# Patient Record
Sex: Female | Born: 2000 | Race: Black or African American | Hispanic: No | Marital: Single | Smoking: Never smoker
Health system: Southern US, Community
[De-identification: ages and names within clinical notes are randomized; demographics above are authoritative.]

## PROBLEM LIST (undated history)

## (undated) DIAGNOSIS — F938 Other childhood emotional disorders: Secondary | ICD-10-CM

## (undated) DIAGNOSIS — F3181 Bipolar II disorder: Secondary | ICD-10-CM

## (undated) DIAGNOSIS — N926 Irregular menstruation, unspecified: Secondary | ICD-10-CM

## (undated) DIAGNOSIS — R1110 Vomiting, unspecified: Secondary | ICD-10-CM

## (undated) DIAGNOSIS — F419 Anxiety disorder, unspecified: Secondary | ICD-10-CM

## (undated) DIAGNOSIS — F329 Major depressive disorder, single episode, unspecified: Secondary | ICD-10-CM

## (undated) DIAGNOSIS — H6690 Otitis media, unspecified, unspecified ear: Secondary | ICD-10-CM

## (undated) DIAGNOSIS — R519 Headache, unspecified: Secondary | ICD-10-CM

## (undated) DIAGNOSIS — S82843A Displaced bimalleolar fracture of unspecified lower leg, initial encounter for closed fracture: Secondary | ICD-10-CM

## (undated) DIAGNOSIS — F32A Depression, unspecified: Secondary | ICD-10-CM

## (undated) DIAGNOSIS — M009 Pyogenic arthritis, unspecified: Secondary | ICD-10-CM

## (undated) DIAGNOSIS — R51 Headache: Secondary | ICD-10-CM

## (undated) DIAGNOSIS — J302 Other seasonal allergic rhinitis: Secondary | ICD-10-CM

## (undated) DIAGNOSIS — E669 Obesity, unspecified: Secondary | ICD-10-CM

## (undated) HISTORY — DX: Displaced bimalleolar fracture of unspecified lower leg, initial encounter for closed fracture: S82.843A

## (undated) HISTORY — DX: Pyogenic arthritis, unspecified: M00.9

---
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
## 2018-03-21 ENCOUNTER — Ambulatory Visit: Payer: Medicaid Other | Admitting: Family Medicine

## 2019-11-04 ENCOUNTER — Emergency Department (HOSPITAL_COMMUNITY): Payer: Self-pay

## 2019-11-04 ENCOUNTER — Encounter (HOSPITAL_COMMUNITY): Payer: Self-pay | Admitting: *Deleted

## 2019-11-04 ENCOUNTER — Emergency Department (HOSPITAL_COMMUNITY)
Admission: EM | Admit: 2019-11-04 | Discharge: 2019-11-04 | Disposition: A | Discharge status: Home / Self Care | Payer: Self-pay | Attending: Emergency Medicine | Admitting: Emergency Medicine

## 2019-11-04 DIAGNOSIS — Z5321 Procedure and treatment not carried out due to patient leaving prior to being seen by health care provider: Secondary | ICD-10-CM | POA: Insufficient documentation

## 2019-11-04 DIAGNOSIS — R079 Chest pain, unspecified: Secondary | ICD-10-CM | POA: Insufficient documentation

## 2019-11-04 DIAGNOSIS — R109 Unspecified abdominal pain: Secondary | ICD-10-CM | POA: Insufficient documentation

## 2019-11-04 LAB — BASIC METABOLIC PANEL
Anion gap: 17 — ABNORMAL HIGH (ref 5–15)
BUN: 10 mg/dL (ref 6–20)
CO2: 18 mmol/L — ABNORMAL LOW (ref 22–32)
Calcium: 10.2 mg/dL (ref 8.9–10.3)
Chloride: 107 mmol/L (ref 98–111)
Creatinine, Ser: 1.35 mg/dL — ABNORMAL HIGH (ref 0.44–1.00)
GFR calc Af Amer: >60 mL/min (ref >60)
GFR calc non Af Amer: 56 mL/min — ABNORMAL LOW (ref >60)
Glucose, Bld: 269 mg/dL — ABNORMAL HIGH (ref 70–99)
Potassium: 4.2 mmol/L (ref 3.5–5.1)
Sodium: 142 mmol/L (ref 135–145)

## 2019-11-04 LAB — I-STAT BETA HCG BLOOD, ED (MC, WL, AP ONLY): I-stat hCG, quantitative: <5 m[IU]/mL (ref <5)

## 2019-11-04 LAB — CBC
HCT: 43 % (ref 36.0–46.0)
Hemoglobin: 14 g/dL (ref 12.0–15.0)
MCH: 30.8 pg (ref 26.0–34.0)
MCHC: 32.6 g/dL (ref 30.0–36.0)
MCV: 94.5 fL (ref 80.0–100.0)
Platelets: 312 10*3/uL (ref 150–400)
RBC: 4.55 MIL/uL (ref 3.87–5.11)
RDW: 11.9 % (ref 11.5–15.5)
WBC: 12.7 10*3/uL — ABNORMAL HIGH (ref 4.0–10.5)
nRBC: 0 % (ref 0.0–0.2)

## 2019-11-04 LAB — TROPONIN I (HIGH SENSITIVITY): Troponin I (High Sensitivity): 6 ng/L (ref <18)

## 2019-11-04 NOTE — ED Notes (Signed)
Pt stating she does not want to wait any longer and will try going somewhere else. IV taken out and Pt seen leaving with mother.

## 2019-11-04 NOTE — ED Triage Notes (Signed)
To ED via EMS- from work, for eval of cp and abd pain while at work. 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 States she started with nausea and went to restroom where she had an episode of vomiting then cp started. Pt given 500cc fluids, zofan IV, 325mg  ASA by EMS prior to arrival. Pt appears in nad. Denies numbness to extremities or sob.

## 2020-01-07 ENCOUNTER — Other Ambulatory Visit: Payer: Self-pay

## 2020-01-07 ENCOUNTER — Emergency Department (HOSPITAL_COMMUNITY)
Admission: EM | Admit: 2020-01-07 | Discharge: 2020-01-07 | Disposition: A | Discharge status: Home / Self Care | Payer: Self-pay | Attending: Emergency Medicine | Admitting: Emergency Medicine

## 2020-01-07 ENCOUNTER — Encounter (HOSPITAL_COMMUNITY): Payer: Self-pay

## 2020-01-07 ENCOUNTER — Emergency Department (HOSPITAL_COMMUNITY): Payer: Self-pay

## 2020-01-07 DIAGNOSIS — R1013 Epigastric pain: Secondary | ICD-10-CM | POA: Insufficient documentation

## 2020-01-07 DIAGNOSIS — Z79899 Other long term (current) drug therapy: Secondary | ICD-10-CM | POA: Insufficient documentation

## 2020-01-07 DIAGNOSIS — Z7722 Contact with and (suspected) exposure to environmental tobacco smoke (acute) (chronic): Secondary | ICD-10-CM | POA: Insufficient documentation

## 2020-01-07 DIAGNOSIS — I10 Essential (primary) hypertension: Secondary | ICD-10-CM | POA: Insufficient documentation

## 2020-01-07 LAB — CBC WITH DIFFERENTIAL/PLATELET
Abs Immature Granulocytes: 0.03 10*3/uL (ref 0.00–0.07)
Basophils Absolute: 0 10*3/uL (ref 0.0–0.1)
Basophils Relative: 0 %
Eosinophils Absolute: 0 10*3/uL (ref 0.0–0.5)
Eosinophils Relative: 0 %
HCT: 43.5 % (ref 36.0–46.0)
Hemoglobin: 14.5 g/dL (ref 12.0–15.0)
Immature Granulocytes: 0 %
Lymphocytes Relative: 31 %
Lymphs Abs: 2.3 10*3/uL (ref 0.7–4.0)
MCH: 31.7 pg (ref 26.0–34.0)
MCHC: 33.3 g/dL (ref 30.0–36.0)
MCV: 95.2 fL (ref 80.0–100.0)
Monocytes Absolute: 0.5 10*3/uL (ref 0.1–1.0)
Monocytes Relative: 7 %
Neutro Abs: 4.8 10*3/uL (ref 1.7–7.7)
Neutrophils Relative %: 62 %
Platelets: 273 10*3/uL (ref 150–400)
RBC: 4.57 MIL/uL (ref 3.87–5.11)
RDW: 11.9 % (ref 11.5–15.5)
WBC: 7.7 10*3/uL (ref 4.0–10.5)
nRBC: 0 % (ref 0.0–0.2)

## 2020-01-07 LAB — URINALYSIS, ROUTINE W REFLEX MICROSCOPIC
Bilirubin Urine: NEGATIVE
Glucose, UA: >=500 mg/dL — AB
Hgb urine dipstick: NEGATIVE
Ketones, ur: 20 mg/dL — AB
Leukocytes,Ua: NEGATIVE
Nitrite: NEGATIVE
Protein, ur: NEGATIVE mg/dL
Specific Gravity, Urine: 1.026 (ref 1.005–1.030)
pH: 8 (ref 5.0–8.0)

## 2020-01-07 LAB — COMPREHENSIVE METABOLIC PANEL
ALT: 60 U/L — ABNORMAL HIGH (ref 0–44)
AST: 58 U/L — ABNORMAL HIGH (ref 15–41)
Albumin: 4.5 g/dL (ref 3.5–5.0)
Alkaline Phosphatase: 72 U/L (ref 38–126)
Anion gap: 14 (ref 5–15)
BUN: 10 mg/dL (ref 6–20)
CO2: 20 mmol/L — ABNORMAL LOW (ref 22–32)
Calcium: 9.8 mg/dL (ref 8.9–10.3)
Chloride: 106 mmol/L (ref 98–111)
Creatinine, Ser: 0.58 mg/dL (ref 0.44–1.00)
GFR, Estimated: >60 mL/min (ref >60)
Glucose, Bld: 244 mg/dL — ABNORMAL HIGH (ref 70–99)
Potassium: 3.9 mmol/L (ref 3.5–5.1)
Sodium: 140 mmol/L (ref 135–145)
Total Bilirubin: 1 mg/dL (ref 0.3–1.2)
Total Protein: 8 g/dL (ref 6.5–8.1)

## 2020-01-07 LAB — I-STAT BETA HCG BLOOD, ED (MC, WL, AP ONLY): I-stat hCG, quantitative: <5 m[IU]/mL (ref <5)

## 2020-01-07 LAB — LIPASE, BLOOD: Lipase: 27 U/L (ref 11–51)

## 2020-01-07 MED ORDER — SODIUM CHLORIDE 0.9 % IV BOLUS
1000.0000 mL | Freq: Once | INTRAVENOUS | Status: AC
  Administered 2020-01-07: 1000 mL via INTRAVENOUS

## 2020-01-07 MED ORDER — FAMOTIDINE IN NACL 20-0.9 MG/50ML-% IV SOLN
20.0000 mg | Freq: Once | INTRAVENOUS | Status: AC
  Administered 2020-01-07: 20 mg via INTRAVENOUS
  Filled 2020-01-07: qty 50

## 2020-01-07 MED ORDER — FAMOTIDINE 20 MG PO TABS: 20.0000 mg | ORAL_TABLET | Freq: Every day | ORAL | 1 refills | Status: DC

## 2020-01-07 MED ORDER — SUCRALFATE 1 G PO TABS: 1.0000 g | ORAL_TABLET | Freq: Three times a day (TID) | ORAL | 0 refills | Status: DC

## 2020-01-07 MED ORDER — LIDOCAINE VISCOUS HCL 2 % MT SOLN
15.0000 mL | Freq: Once | OROMUCOSAL | Status: AC
  Administered 2020-01-07: 15 mL via ORAL
  Filled 2020-01-07: qty 15

## 2020-01-07 MED ORDER — ALUM & MAG HYDROXIDE-SIMETH 200-200-20 MG/5ML PO SUSP
30.0000 mL | Freq: Once | ORAL | Status: AC
  Administered 2020-01-07: 30 mL via ORAL
  Filled 2020-01-07: qty 30

## 2020-01-07 MED ORDER — IOHEXOL 300 MG/ML  SOLN
100.0000 mL | Freq: Once | INTRAMUSCULAR | Status: AC | PRN
  Administered 2020-01-07: 100 mL via INTRAVENOUS

## 2020-01-07 NOTE — ED Notes (Signed)
Patient has a urine culture in the main lab 

## 2020-01-07 NOTE — ED Notes (Signed)
Unable to get vitals patient is in CT

## 2020-01-07 NOTE — ED Provider Notes (Addendum)
[facility] DEPT Provider Note   CSN: [id number] Arrival date & time: 01/07/20  4098     History Chief Complaint  Patient presents with  . Abdominal Pain    Megan Trevino is a 19 y.o. female.  HPI Patient is a 19 year old female with a medical history as noted below.  She presents to the emergency department via EMS due to abdominal pain.  Patient states that her current abdominal pain started about 2 days ago and is worst along the epigastrium.  Reports history of similar symptoms in the past and states that she was told she had "lesions" in the region but denies ever being given a formal diagnosis for her symptoms.  States that her pain has been worsening and patient is tearful throughout my exam.  Reports one episode of nonbilious nonbloody vomiting last night but denies any current vomiting or diarrhea today.  No fevers, chills, vaginal discharge, vaginal bleeding, pelvic pain, chest pain, shortness of breath, leg swelling, dysuria. Pt states she drinks about 1 ETOH drink per week. She smokes marijuana irregularly but denies any other drug use.     Past Medical History:  Diagnosis Date  . Acute ear infection 03/25/2015  . Ankle fracture, bimalleolar, closed 08/27/2014  . Anxiety   . 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 Bipolar 2 disorder ([place])   . Depression   . Headache   . Infection of joint of ankle ([place]) 10/16/2014  . Irregular menses   . Obesity   . Seasonal allergies   . Vomiting    pt. remarks that she has "stomach problem", they haven't figured out what the problem is yet.  Mother states it seems to be associated with her period.      Patient Active Problem List   Diagnosis Date Noted  . Right ear pain 02/23/2017  . Vaginal burning 07/16/2015  . Generalized anxiety disorder 04/22/2015  . Attention deficit hyperactivity disorder (ADHD) 04/22/2015  . 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 Blurred vision, bilateral   . Major depressive disorder, recurrent, severe without psychotic features ([place]) 02/20/2015  . Morbid obesity ([place]) 02/19/2015  . Hypertension 11/12/2014  . Adjustment reaction with anxious mood   . Nausea with vomiting   . Menstrual irregularity 09/19/2014  . Chronic abdominal pain 09/13/2014  . Depression 12/15/2012    Past Surgical History:  Procedure Laterality Date  . I & D EXTREMITY Right 10/20/2014   Procedure: IRRIGATION AND DEBRIDEMENT EXTREMITY WITH ANTIBOTIC BEADS;  Surgeon: Meredith Pel, MD;  Location: [facility];  Service: Orthopedics;  Laterality: Right;  . ORIF ANKLE FRACTURE Right 08/27/2014   Procedure: OPEN REDUCTION INTERNAL FIXATION (ORIF) ANKLE FRACTURE;  Surgeon: Meredith Pel, MD;  Location: [facility];  Service: Orthopedics;  Laterality: Right;     OB History   No obstetric history on file.     Family History  Problem Relation Age of Onset  . Alcohol abuse Mother   . Depression Mother   . Mental illness Mother   . Alcohol abuse Maternal Grandmother   . Mental illness Maternal Grandmother   . Alcohol abuse Father   . Depression Sister     Social History   Tobacco Use  . Smoking status: Passive Smoke Exposure - Never Smoker  . Smokeless tobacco: Never Used  Substance Use Topics  . Alcohol use: No    Comment: occasional drinker and THC  use per record hx  . Drug use: No    Types: Marijuana    Comment: THC smoked in 7th grade, stopped THC in 8th grade 2 blunts per record hx    Home Medications Prior to Admission medications   Medication Sig Start Date End Date Taking? Authorizing Provider  amoxicillin-clavulanate (AUGMENTIN) 875-125 MG tablet Take 1 tablet by mouth 2 (two) times daily. 02/23/17   Verner Mould, MD  ARIPiprazole (ABILIFY) 10 MG tablet Take 1 tablet (10 mg total) by mouth at bedtime. 06/10/15   Leonides Grills, MD    dicyclomine (BENTYL) 20 MG tablet Take 1 tablet (20 mg total) by mouth 2 (two) times daily. 03/28/16   Mikell, Jeani Sow, MD  hydrochlorothiazide (HYDRODIURIL) 12.5 MG tablet Take 1 tablet (12.5 mg total) by mouth daily. 11/10/15   McKeag, Marylynn Pearson, MD  metFORMIN (GLUCOPHAGE) 500 MG tablet Take 1 tablet (500 mg total) by mouth 2 (two) times daily with a meal. 11/10/15   McKeag, Marylynn Pearson, MD  pantoprazole (PROTONIX) 40 MG tablet Take 1 tablet (40 mg total) by mouth daily. 03/28/16   Mikell, Jeani Sow, MD  polyethylene glycol (MIRALAX / GLYCOLAX) packet Take 17 g by mouth daily. 03/28/16   Mikell, Jeani Sow, MD  sertraline (ZOLOFT) 50 MG tablet Take 3 tablets (150 mg total) by mouth daily. 06/10/15   Leonides Grills, MD    Allergies    Patient has no known allergies.  Review of Systems   Review of Systems  All other systems reviewed and are negative. Ten systems reviewed and are negative for acute change, except as noted in the HPI.   Physical Exam Updated Vital Signs BP (!) 142/98 (BP Location: Left Arm)   Pulse 88   Temp 97.6 F (36.4 C) (Oral)   Resp 20   Ht 5\' 9"  (1.753 m)   Wt 131.5 kg   LMP  (LMP Unknown)   SpO2 97%   BMI 42.83 kg/m   Physical Exam Vitals and nursing note reviewed.  Constitutional:      General: She is not in acute distress.    Appearance: Normal appearance. She is well-developed. She is obese. She is not ill-appearing, toxic-appearing or diaphoretic.     Comments: Patient is a well-developed adult female lying in the semi-Fowlers position.  She is tearful throughout my exam and appears to be in pain.  She speaks clearly and coherently.  HENT:     Head: Normocephalic and atraumatic.     Right Ear: External ear normal.     Left Ear: External ear normal.     Nose: Nose normal.     Mouth/Throat:     Mouth: Mucous membranes are moist.     Pharynx: Oropharynx is clear. No oropharyngeal exudate or posterior oropharyngeal erythema.  Eyes:     Extraocular  Movements: Extraocular movements intact.  Cardiovascular:     Rate and Rhythm: Normal rate and regular rhythm.     Pulses: Normal pulses.     Heart sounds: Normal heart sounds. No murmur heard.  No friction rub. No gallop.   Pulmonary:     Effort: Pulmonary effort is normal. No respiratory distress.     Breath sounds: Normal breath sounds. No stridor. No wheezing, rhonchi or rales.  Abdominal:     General: Abdomen is flat and protuberant. There is no distension.     Palpations: Abdomen is soft.     Tenderness: There is abdominal tenderness in the right upper  quadrant and epigastric area. There is no guarding or rebound. Negative signs include McBurney's sign.     Comments: Abdomen is soft.  Moderate tenderness noted along the epigastrium and right upper quadrant.  Pain seems to be worst along the epigastrium.  No rebound or guarding.  Negative McBurney point tenderness.  No lower abdominal tenderness.  Musculoskeletal:        General: Normal range of motion.     Cervical back: Normal range of motion and neck supple. No tenderness.  Skin:    General: Skin is warm and dry.  Neurological:     General: No focal deficit present.     Mental Status: She is alert and oriented to person, place, and time.  Psychiatric:        Mood and Affect: Mood normal.        Behavior: Behavior normal.    ED Results / Procedures / Treatments   Labs (all labs ordered are listed, but only abnormal results are displayed) Labs Reviewed  COMPREHENSIVE METABOLIC PANEL - Abnormal; Notable for the following components:      Result Value   CO2 20 (*)    Glucose, Bld 244 (*)    AST 58 (*)    ALT 60 (*)    All other components within normal limits  URINALYSIS, ROUTINE W REFLEX MICROSCOPIC - Abnormal; Notable for the following components:   APPearance HAZY (*)    Glucose, UA >=500 (*)    Ketones, ur 20 (*)    Bacteria, UA RARE (*)    All other components within normal limits  LIPASE, BLOOD  CBC WITH  DIFFERENTIAL/PLATELET  I-STAT BETA HCG BLOOD, ED ([facility], [facility], [facility] ONLY)    EKG None  Radiology CT ABDOMEN PELVIS W CONTRAST  Result Date: 01/07/2020 CLINICAL DATA:  Upper abdominal pain, primarily right sided EXAM: CT ABDOMEN AND PELVIS WITH CONTRAST TECHNIQUE: Multidetector CT imaging of the abdomen and pelvis was performed using the standard protocol following bolus administration of intravenous contrast. CONTRAST:  182mL OMNIPAQUE IOHEXOL 300 MG/ML  SOLN COMPARISON:  CT abdomen and pelvis April 08, 2014; abdominal MRI September 14, 2014 FINDINGS: Lower chest: Lung bases are clear. Hepatobiliary: There is underlying hepatic steatosis. There is a subtle enhancing lesion in the right lobe of the liver in segment 5 measuring 3.2 x 2.9 cm, similar in size compared to prior MR from 2016. No other liver lesions are appreciable by CT. Gallbladder wall is not appreciably thickened. There is no evident biliary duct dilatation. Pancreas: No pancreatic mass or inflammatory focus. Spleen: No splenic lesions evident. Adrenals/Urinary Tract: Adrenals bilaterally appear normal. Kidneys bilaterally show no evident mass or hydronephrosis on either side. No appreciable renal or ureteral calculus is evident on either side. Urinary bladder is midline with wall thickness within normal limits. Stomach/Bowel: There is no appreciable bowel wall or mesenteric thickening. No evident bowel obstruction. Terminal ileum appears normal. There is no evident free air or portal venous air. Vascular/Lymphatic: No abdominal aortic aneurysm. No arterial vascular lesions evident. Major venous structures appear patent. There is no evident adenopathy in the abdomen or pelvis. Reproductive: Uterus is midline. Tampon present. No evident adnexal mass. Other: The appendix appears normal. There is no evident ascites or abscess in the abdomen or pelvis. Musculoskeletal: No blastic or lytic bone lesions are evident. There is a prominent spur along the  leftward aspect of L5-S1 which is impressing on the left S1 nerve root. No frank stenosis. No intramuscular or abdominal wall lesions  are evident. IMPRESSION: 1. Mass in the right lobe of the liver appears unchanged compared to 2016 MR study. Stability since 2016 is felt to be indicative of benign etiology. No other liver lesions evident. There is underlying hepatic steatosis. 2. No bowel wall thickening or bowel obstruction. No abscess in the abdomen or pelvis. Appendix appears normal. 3. No evident renal or ureteral calculus. No hydronephrosis. Urinary bladder wall thickness normal. 4. Spur along the leftward aspect at L5-S1 causing impression on the proximal left S1 nerve root. Electronically Signed   By: Lowella Grip III M.D.   On: 01/07/2020 13:45    Procedures Procedures (including critical care time)  Medications Ordered in ED Medications  famotidine (PEPCID) IVPB 20 mg premix (0 mg Intravenous Stopped 01/07/20 1024)  alum & mag hydroxide-simeth (MAALOX/MYLANTA) 200-200-20 MG/5ML suspension 30 mL (30 mLs Oral Given 01/07/20 0953)    And  lidocaine (XYLOCAINE) 2 % viscous mouth solution 15 mL (15 mLs Oral Given 01/07/20 0953)  sodium chloride 0.9 % bolus 1,000 mL (1,000 mLs Intravenous New Bag/Given 01/07/20 0954)  iohexol (OMNIPAQUE) 300 MG/ML solution 100 mL (100 mLs Intravenous Contrast Given 01/07/20 1304)   ED Course  I have reviewed the triage vital signs and the nursing notes.  Pertinent labs & imaging results that were available during my care of the patient were reviewed by me and considered in my medical decision making (see chart for details).  Clinical Course as of Jan 06 1530  Wed Jan 07, 2020  1053 Lipase: 27 [LJ]  1053 WBC: 7.7 [LJ]  1053 I-stat hCG, quantitative: <5.0 [LJ]  1053 Patient notes moderate relief of her abdominal pain with GI cocktail and Pepcid.  Patient has about 400 cc of IV fluids left.  Will reassess after IV fluids and remaining lab work has  resulted. Will obtain a CT scan of the abdomen as well.    [LJ]    Clinical Course User Index [LJ] Rayna Sexton, PA-C   MDM Rules/Calculators/A&P                          Pt is a 19 y.o. female that presents with a history, physical exam, and ED Clinical Course as noted above.   Patient presents today with epigastric pain.  Patient has a history of similar symptoms for which she was evaluated by gastroenterology for in the past.  Physical exam significant for moderate pain along the epigastrium.  No lower abdominal pain.  No pelvic pain.  No complaints of vaginal discharge, dysuria, hematuria.  Negative pregnancy test.   I obtained basic labs on the patient showing an elevated AST at 58 with an ALT of 60.  Hyperglycemia of 244.  No elevation in lipase.  Glucosuria, ketonuria, rare bacteria noted on UA.  CBC without leukocytosis or neutrophilia.  No anion gap.   I obtained a CT scan of the abdomen which I reviewed with findings as noted above.  Patient does have a mass in the right lobe of the liver which appears unchanged based on comparison to an MR study in 2016.  This along with her hepatic steatosis likely explains her elevated LFTs.  No other findings on CT scan to explain patient's pain.  Patient was given IV fluids as well as IV Pepcid and a GI cocktail upon arrival.  She states that this provided moderate relief of her pain.  I am concerned that her pain is possibly secondary to a gastritis given  it was also worse along her epigastrium.  We will start patient on a course of Carafate as well as Pepcid.  She was given GI follow-up and I recommended that she call them soon as possible to get a follow-up appointment.  She knows she can return to the ER with any new or worsening symptoms.  Her questions were answered and she was amicable at the time of discharge.  She was initially hypertensive on arrival which resolved.  Her vital signs are now stable.  She is afebrile, not  tachycardic, normotensive, not hypoxic.   An After Visit Summary was printed and given to the patient.  Patient discharged to home/self care.  Condition at discharge: Stable  Note: Portions of this report may have been transcribed using voice recognition software. Every effort was made to ensure accuracy; however, inadvertent computerized transcription errors may be present.   Final Clinical Impression(s) / ED Diagnoses Final diagnoses:  Epigastric abdominal pain    Rx / DC Orders ED Discharge Orders         Ordered    sucralfate (CARAFATE) 1 g tablet  3 times daily with meals & bedtime        01/07/20 1513    famotidine (PEPCID) 20 MG tablet  Daily        01/07/20 1513           Rayna Sexton, PA-C 01/07/20 1531    Rayna Sexton, PA-C 01/07/20 1537    Tegeler, Gwenyth Allegra, MD 01/07/20 [phone]

## 2020-01-07 NOTE — ED Triage Notes (Signed)
19 yo female BIBA c/o non-radiating midumbilical abd pain and nausea for the past 3 days. No vomiting or diarrhea per EMS.

## 2020-01-07 NOTE — Discharge Instructions (Addendum)
I am prescribing you 2 new medications.  The first medication is called Carafate.  This along with Pepcid could help significantly with your stomach pain.  You can take this up to 3 times a day with meals to help reduce your symptoms.  I am also prescribing a medication called Pepcid.  You can also purchase this over-the-counter.  Please start taking this once per day to help reduce stomach acid and hopefully your pain as well.  I have given you a referral to a gastroenterologist.  Please give them a call as soon as possible to schedule a follow-up appointment.  Please return to the ER if you develop any new or worsening symptoms.  It was a pleasure to meet you.

## 2020-09-26 ENCOUNTER — Other Ambulatory Visit: Payer: Self-pay

## 2020-09-26 ENCOUNTER — Encounter (HOSPITAL_COMMUNITY): Payer: Self-pay | Admitting: *Deleted

## 2020-09-26 ENCOUNTER — Ambulatory Visit (HOSPITAL_COMMUNITY)
Admission: EM | Admit: 2020-09-26 | Discharge: 2020-09-26 | Disposition: A | Discharge status: Other Acute Inpatient Hospital | Payer: Self-pay | Attending: Internal Medicine | Admitting: Internal Medicine

## 2020-09-26 ENCOUNTER — Encounter (HOSPITAL_COMMUNITY): Payer: Self-pay | Admitting: Emergency Medicine

## 2020-09-26 ENCOUNTER — Emergency Department (HOSPITAL_COMMUNITY)
Admission: EM | Admit: 2020-09-26 | Discharge: 2020-09-26 | Disposition: A | Discharge status: Home / Self Care | Payer: Self-pay | Attending: Emergency Medicine | Admitting: Emergency Medicine

## 2020-09-26 ENCOUNTER — Emergency Department (HOSPITAL_COMMUNITY): Payer: Self-pay

## 2020-09-26 DIAGNOSIS — R079 Chest pain, unspecified: Secondary | ICD-10-CM

## 2020-09-26 DIAGNOSIS — R0682 Tachypnea, not elsewhere classified: Secondary | ICD-10-CM | POA: Insufficient documentation

## 2020-09-26 DIAGNOSIS — Z79899 Other long term (current) drug therapy: Secondary | ICD-10-CM | POA: Insufficient documentation

## 2020-09-26 DIAGNOSIS — R52 Pain, unspecified: Secondary | ICD-10-CM

## 2020-09-26 DIAGNOSIS — U071 COVID-19: Secondary | ICD-10-CM

## 2020-09-26 DIAGNOSIS — R0602 Shortness of breath: Secondary | ICD-10-CM

## 2020-09-26 DIAGNOSIS — R000 Tachycardia, unspecified: Secondary | ICD-10-CM

## 2020-09-26 DIAGNOSIS — I10 Essential (primary) hypertension: Secondary | ICD-10-CM | POA: Insufficient documentation

## 2020-09-26 LAB — CBC
HCT: 44.3 % (ref 36.0–46.0)
Hemoglobin: 14.7 g/dL (ref 12.0–15.0)
MCH: 31.6 pg (ref 26.0–34.0)
MCHC: 33.2 g/dL (ref 30.0–36.0)
MCV: 95.3 fL (ref 80.0–100.0)
Platelets: 271 10*3/uL (ref 150–400)
RBC: 4.65 MIL/uL (ref 3.87–5.11)
RDW: 11.5 % (ref 11.5–15.5)
WBC: 7.5 10*3/uL (ref 4.0–10.5)
nRBC: 0 % (ref 0.0–0.2)

## 2020-09-26 LAB — BASIC METABOLIC PANEL
Anion gap: 8 (ref 5–15)
BUN: 10 mg/dL (ref 6–20)
CO2: 24 mmol/L (ref 22–32)
Calcium: 9.4 mg/dL (ref 8.9–10.3)
Chloride: 106 mmol/L (ref 98–111)
Creatinine, Ser: 0.57 mg/dL (ref 0.44–1.00)
GFR, Estimated: >60 mL/min (ref >60)
Glucose, Bld: 182 mg/dL — ABNORMAL HIGH (ref 70–99)
Potassium: 4 mmol/L (ref 3.5–5.1)
Sodium: 138 mmol/L (ref 135–145)

## 2020-09-26 LAB — I-STAT BETA HCG BLOOD, ED (MC, WL, AP ONLY): I-stat hCG, quantitative: <5 m[IU]/mL (ref <5)

## 2020-09-26 LAB — TROPONIN I (HIGH SENSITIVITY)
Troponin I (High Sensitivity): <2 ng/L (ref <18)
Troponin I (High Sensitivity): <2 ng/L (ref <18)

## 2020-09-26 MED ORDER — ACETAMINOPHEN 325 MG PO TABS
650.0000 mg | ORAL_TABLET | Freq: Once | ORAL | Status: AC
  Administered 2020-09-26: 650 mg via ORAL
  Filled 2020-09-26: qty 2

## 2020-09-26 MED ORDER — SODIUM CHLORIDE 0.9 % IV BOLUS
1000.0000 mL | Freq: Once | INTRAVENOUS | Status: AC
  Administered 2020-09-26: 1000 mL via INTRAVENOUS

## 2020-09-26 MED ORDER — ALBUTEROL SULFATE HFA 108 (90 BASE) MCG/ACT IN AERS
2.0000 | INHALATION_SPRAY | Freq: Once | RESPIRATORY_TRACT | Status: AC
  Administered 2020-09-26: 2 via RESPIRATORY_TRACT
  Filled 2020-09-26: qty 6.7

## 2020-09-26 NOTE — ED Triage Notes (Signed)
Patient from [facility]. Patient covid+ Thursday, complaint of shortness of breath and chest pain.

## 2020-09-26 NOTE — ED Provider Notes (Signed)
[facility] EMERGENCY DEPARTMENT Provider Note   CSN: [id number] Arrival date & time: 09/26/20  1404     History Chief Complaint  Patient presents with   Covid+   Chest Pain   Shortness of Breath    Megan Trevino is a 20 y.o. female history of bipolar, depression, here presenting with COVID symptoms.  Patient works for [employer] and was tested positive for COVID during her shift on Thursday night (7/14).  She states that she mainly has some chills and started having shortness of breath for the last 2 days.  Patient denies any actual fevers.  Patient states that health at work called her today and since she has persistent chest pain or shortness of breath she was told to come here for further evaluation.  Patient states that she is vaccinated against COVID but did not get her booster.  She states that she is otherwise healthy.  The history is provided by the patient.      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 remarks that she has "stomach problem", they haven't figured out what the problem is yet.  Mother states it seems to be associated with her period.      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   Blurred vision, bilateral    Major depressive disorder, recurrent, severe without psychotic features ([place]) 02/20/2015   Morbid obesity ([place])  02/19/2015   Hypertension 11/12/2014   Adjustment reaction with anxious mood    Nausea with vomiting    Menstrual irregularity 09/19/2014   Chronic abdominal pain 09/13/2014   Depression 12/15/2012    Past Surgical History:  Procedure Laterality Date   I & D EXTREMITY Right 10/20/2014   Procedure: IRRIGATION AND DEBRIDEMENT EXTREMITY WITH ANTIBOTIC BEADS;  Surgeon: Meredith Pel, MD;  Location: [facility];  Service: Orthopedics;  Laterality: Right;   ORIF ANKLE FRACTURE Right 08/27/2014   Procedure: OPEN REDUCTION INTERNAL FIXATION (ORIF) ANKLE FRACTURE;  Surgeon: Meredith Pel, MD;  Location: [facility];  Service: Orthopedics;  Laterality: Right;     OB History   No obstetric history on file.     Family History  Problem Relation Age of Onset   Alcohol abuse Mother    Depression Mother    Mental illness Mother    Alcohol abuse Father    Depression Sister    Alcohol abuse Maternal Grandmother    Mental illness Maternal Grandmother     Social History   Tobacco Use   Smoking status: Never    Passive exposure: Yes   Smokeless tobacco: Never  Substance Use Topics   Alcohol use: No    Comment: occasional drinker and THC use per record hx   Drug use: No    Types: Marijuana    Comment: THC smoked in 7th grade, stopped  THC in 8th grade 2 blunts per record hx    Home Medications Prior to Admission medications   Medication Sig Start Date End Date Taking? Authorizing Provider  ARIPiprazole (ABILIFY) 10 MG tablet Take 1 tablet (10 mg total) by mouth at bedtime. Patient not taking: Reported on 01/07/2020 06/10/15   Leonides Grills, MD  famotidine (PEPCID) 20 MG tablet Take 1 tablet (20 mg total) by mouth daily. 01/07/20   Rayna Sexton, PA-C  hydrochlorothiazide (HYDRODIURIL) 12.5 MG tablet Take 1 tablet (12.5 mg total) by mouth daily. Patient not taking: Reported on 01/07/2020 11/10/15   McKeag, Marylynn Pearson, MD  metFORMIN (GLUCOPHAGE) 500 MG tablet Take 1 tablet (500 mg total)  by mouth 2 (two) times daily with a meal. Patient not taking: Reported on 01/07/2020 11/10/15   McKeag, Marylynn Pearson, MD  sertraline (ZOLOFT) 50 MG tablet Take 3 tablets (150 mg total) by mouth daily. Patient not taking: Reported on 01/07/2020 06/10/15   Leonides Grills, MD  sucralfate (CARAFATE) 1 g tablet Take 1 tablet (1 g total) by mouth 4 (four) times daily -  with meals and at bedtime. 01/07/20   Rayna Sexton, PA-C    Allergies    Patient has no known allergies.  Review of Systems   Review of Systems  Respiratory:  Positive for shortness of breath.   Cardiovascular:  Positive for chest pain.  All other systems reviewed and are negative.  Physical Exam Updated Vital Signs BP 134/78   Pulse 91   Temp 99.3 F (37.4 C)   Resp 14   LMP  (LMP Unknown) Comment: PT does not know  SpO2 98%   Physical Exam Vitals and nursing note reviewed.  Constitutional:      Comments: Slightly tachypneic  HENT:     Head: Normocephalic.  Eyes:     Extraocular Movements: Extraocular movements intact.     Pupils: Pupils are equal, round, and reactive to light.  Cardiovascular:     Rate and Rhythm: Normal rate and regular rhythm.     Heart sounds: Normal heart sounds.  Pulmonary:     Comments: Diminished bilaterally but no obvious wheezing or crackles Abdominal:     General: Bowel sounds are normal.     Palpations: Abdomen is soft.  Musculoskeletal:        General: Normal range of motion.     Cervical back: Normal range of motion and neck supple.  Skin:    General: Skin is warm.     Capillary Refill: Capillary refill takes less than 2 seconds.  Neurological:     General: No focal deficit present.     Mental Status: She is oriented to person, place, and time.  Psychiatric:        Mood and Affect: Mood normal.        Behavior: Behavior normal.    ED Results / Procedures / Treatments   Labs (all labs ordered are listed, but only abnormal results are displayed) Labs Reviewed   BASIC METABOLIC PANEL - Abnormal; Notable for the following components:      Result Value   Glucose, Bld 182 (*)    All other components within normal limits  CBC  I-STAT BETA HCG BLOOD, ED ([facility], [facility], [facility] ONLY)  TROPONIN I (HIGH SENSITIVITY)  TROPONIN I (HIGH SENSITIVITY)    EKG None  Radiology DG Chest Port 1 View  Result Date: 09/26/2020 CLINICAL DATA:  Shortness of breath and chest pain.  COVID positive. EXAM: PORTABLE CHEST  1 VIEW COMPARISON:  11/04/2019 FINDINGS: The cardiomediastinal silhouette is unremarkable. There is no evidence of focal airspace disease, pulmonary edema, suspicious pulmonary nodule/mass, pleural effusion, or pneumothorax. No acute bony abnormalities are identified. IMPRESSION: No active disease. Electronically Signed   By: Margarette Canada M.D.   On: 09/26/2020 15:09    Procedures Procedures   Medications Ordered in ED Medications  sodium chloride 0.9 % bolus 1,000 mL (1,000 mLs Intravenous New Bag/Given 09/26/20 1646)  albuterol (VENTOLIN HFA) 108 (90 Base) MCG/ACT inhaler 2 puff (2 puffs Inhalation Given 09/26/20 1645)  acetaminophen (TYLENOL) tablet 650 mg (650 mg Oral Given 09/26/20 1645)    ED Course  I have reviewed the triage vital signs and the nursing notes.  Pertinent labs & imaging results that were available during my care of the patient were reviewed by me and considered in my medical decision making (see chart for details).    MDM Rules/Calculators/A&P                         Megan Trevino is a 20 y.o. female here with chest pain, shortness of breath. Tested positive for COVID on 7/14.  Patient has been having ongoing symptoms.  Patient has normal oxygen saturation.  Patient appears comfortable.  Labs were ordered in triage.  Chest x-ray is clear.  At this point, mainly just need symptomatic management.  We will give her some albuterol as needed.  She does not qualify for Paxlovid.   6:55 PM Labs are unremarkable.  Felt better and stable  for discharge.  Told her to stay home and health at work will follow-up with her   Final Clinical Impression(s) / ED Diagnoses Final diagnoses:  Pain    Rx / DC Orders ED Discharge Orders     None        Drenda Freeze, MD 09/26/20 [phone]

## 2020-09-26 NOTE — ED Provider Notes (Signed)
[facility]    CSN: [id number] Arrival date & time: 09/26/20  1211      History   Chief Complaint Chief Complaint  Patient presents with   Chest Pain   Shortness of Breath    HPI Megan Trevino is a 20 y.o. female.   Patient was briefly seen in triage for evaluation.  She is presenting today complaining of 1 day history of generalized body aches, sharp localized mid chest pain and significant shortness of breath.  She had someone at work tested her for [employer] yesterday upon onset of symptoms and she tested positive at that time.  She denies significant congestion, sore throat, headache, dizziness, abdominal pain, nausea vomiting or diarrhea.  Has not been trying anything over-the-counter for symptoms thus far.  Denies any known chronic pulmonary issues.  States it is very abnormal for her to have trouble breathing.  She is a non-smoker, not currently on any hormonal contraceptives, no history of recent surgeries, sedentary behavior, past DVT issues.     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 remarks that she has "stomach problem", they haven't figured out what the problem is yet.  Mother states it seems to be associated with her period.      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   Blurred vision, bilateral    Major depressive disorder, recurrent,  severe without psychotic features ([place]) 02/20/2015   Morbid obesity ([place]) 02/19/2015   Hypertension 11/12/2014   Adjustment reaction with anxious mood    Nausea with vomiting    Menstrual irregularity 09/19/2014   Chronic abdominal pain 09/13/2014   Depression 12/15/2012    Past Surgical History:  Procedure Laterality Date   I & D EXTREMITY Right 10/20/2014   Procedure: IRRIGATION AND DEBRIDEMENT EXTREMITY WITH ANTIBOTIC BEADS;  Surgeon: Meredith Pel, MD;  Location: [facility];  Service: Orthopedics;  Laterality: Right;   ORIF ANKLE FRACTURE Right 08/27/2014   Procedure: OPEN REDUCTION INTERNAL FIXATION (ORIF) ANKLE FRACTURE;  Surgeon: Meredith Pel, MD;  Location: [facility];  Service: Orthopedics;  Laterality: Right;    OB History   No obstetric history on file.      Home Medications    Prior to Admission medications   Medication Sig Start Date End Date Taking? Authorizing Provider  ARIPiprazole (ABILIFY) 10 MG tablet Take 1 tablet (10 mg total) by mouth at bedtime. Patient not taking: Reported on 01/07/2020 06/10/15   Leonides Grills, MD  famotidine (PEPCID) 20 MG tablet Take 1 tablet (20 mg total) by mouth daily. 01/07/20   Rayna Sexton, PA-C  hydrochlorothiazide (HYDRODIURIL) 12.5 MG tablet Take 1 tablet (12.5 mg total) by mouth daily. Patient not taking: Reported on 01/07/2020 11/10/15   McKeag, Marylynn Pearson, MD  metFORMIN (GLUCOPHAGE) 500 MG tablet Take 1 tablet (500 mg total) by  mouth 2 (two) times daily with a meal. Patient not taking: Reported on 01/07/2020 11/10/15   McKeag, Marylynn Pearson, MD  sertraline (ZOLOFT) 50 MG tablet Take 3 tablets (150 mg total) by mouth daily. Patient not taking: Reported on 01/07/2020 06/10/15   Leonides Grills, MD  sucralfate (CARAFATE) 1 g tablet Take 1 tablet (1 g total) by mouth 4 (four) times daily -  with meals and at bedtime. 01/07/20   Rayna Sexton, PA-C    Family History Family History  Problem Relation Age of Onset   Alcohol  abuse Mother    Depression Mother    Mental illness Mother    Alcohol abuse Father    Depression Sister    Alcohol abuse Maternal Grandmother    Mental illness Maternal Grandmother     Social History Social History   Tobacco Use   Smoking status: Never    Passive exposure: Yes   Smokeless tobacco: Never  Substance Use Topics   Alcohol use: No    Comment: occasional drinker and THC use per record hx   Drug use: No    Types: Marijuana    Comment: THC smoked in 7th grade, stopped THC in 8th grade 2 blunts per record hx     Allergies   Patient has no known allergies.   Review of Systems Review of Systems PER HPI    Physical Exam Triage Vital Signs ED Triage Vitals [09/26/20 1301]  Enc Vitals Group     BP 132/75     Pulse Rate (!) 102     Resp 20     Temp 98.6 F (37 C)     Temp src      SpO2 99 %     Weight      Height      Head Circumference      Peak Flow      Pain Score 5     Pain Loc      Pain Edu?      Excl. in [place]?    No data found.  Updated Vital Signs BP 132/75   Pulse (!) 102   Temp 98.6 F (37 C)   Resp 20   LMP  (LMP Unknown) Comment: PT does not know  SpO2 99%   Visual Acuity Right Eye Distance:   Left Eye Distance:   Bilateral Distance:    Right Eye Near:   Left Eye Near:    Bilateral Near:      Physical exam abbreviated today as the decision was already made with the patient to go to the emergency department for further evaluation which she is agreeable to Physical Exam Vitals and nursing note reviewed.  Constitutional:      Comments: Appears uncomfortable  HENT:     Head: Atraumatic.     Nose: Nose normal.     Mouth/Throat:     Mouth: Mucous membranes are moist.     Pharynx: Posterior oropharyngeal erythema present.  Eyes:     Extraocular Movements: Extraocular movements intact.     Conjunctiva/sclera: Conjunctivae normal.  Cardiovascular:     Rate and Rhythm: Regular rhythm. Tachycardia present.     Heart sounds:  Normal heart sounds.  Pulmonary:     Effort: Pulmonary effort is normal.     Comments: Mildly diminished breath sounds throughout but no audible wheezes or rales Abdominal:     General: Bowel sounds are normal. There is no distension.     Palpations: Abdomen is soft.  Tenderness: There is no abdominal tenderness. There is no guarding.  Musculoskeletal:        General: Normal range of motion.     Cervical back: Normal range of motion and neck supple.  Skin:    General: Skin is warm and dry.  Neurological:     Mental Status: She is alert and oriented to person, place, and time.  Psychiatric:        Mood and Affect: Mood normal.        Thought Content: Thought content normal.        Judgment: Judgment normal.     UC Treatments / Results  Labs (all labs ordered are listed, but only abnormal results are displayed) Labs Reviewed - No data to display  EKG   Radiology DG Chest [facility] 1 View  Result Date: 09/26/2020 CLINICAL DATA:  Shortness of breath and chest pain.  COVID positive. EXAM: PORTABLE CHEST 1 VIEW COMPARISON:  11/04/2019 FINDINGS: The cardiomediastinal silhouette is unremarkable. There is no evidence of focal airspace disease, pulmonary edema, suspicious pulmonary nodule/mass, pleural effusion, or pneumothorax. No acute bony abnormalities are identified. IMPRESSION: No active disease. Electronically Signed   By: Margarette Canada M.D.   On: 09/26/2020 15:09    Procedures Procedures (including critical care time)  Medications Ordered in UC Medications - No data to display  Initial Impression / Assessment and Plan / UC Course  I have reviewed the triage vital signs and the nursing notes.  Pertinent labs & imaging results that were available during my care of the patient were reviewed by me and considered in my medical decision making (see chart for details).     Though her symptoms could represent classic COVID symptoms, given the localized nature of her chest pain, her  concomitant shortness of breath and tachycardia between 102 to 110 bpm cannot rule out more emergent etiology such as a pulmonary embolism today to be the cause of her new onset symptoms.  Patient is agreeable to going to the emergency department via private vehicle as she has someone here to transport her for further evaluation to ensure no emergent etiology of her symptoms today.  She is hemodynamically stable for transport at this time.  Final Clinical Impressions(s) / UC Diagnoses   Final diagnoses:  COVID-19  Tachycardia  Chest pain, unspecified type  Shortness of breath   Discharge Instructions   None    ED Prescriptions   None    PDMP not reviewed this encounter.   Volney American, [place] 09/26/20 1906

## 2020-09-26 NOTE — ED Notes (Signed)
Megan Trevino at bedside to eval. PT.

## 2020-09-26 NOTE — ED Notes (Signed)
Patient is resting comfortably. 

## 2020-09-26 NOTE — ED Triage Notes (Signed)
Pt reports a rapid  COVID test was done at [facility] on Friday that was positive for COVID. No record in chart. Pt presents today with SHOB and CP.

## 2020-09-26 NOTE — ED Provider Notes (Signed)
[place]    CSN: [id number] Arrival date & time: 09/26/20  1404      History   Chief Complaint Chief Complaint  Patient presents with   Covid+   Chest Pain   Shortness of Breath    HPI Megan Trevino is a 19 y.o. female.   Patient was briefly seen in triage for evaluation.  She is presenting today complaining of 1 day history of generalized body aches, sharp localized mid chest pain and significant shortness of breath.  She had someone at work tested her for [place] yesterday upon onset of symptoms and she tested positive at that time.  She denies significant congestion, sore throat, headache, dizziness, abdominal pain, nausea vomiting or diarrhea.  Has not been trying anything over-the-counter for symptoms thus far.  Denies any known chronic pulmonary issues.  States it is very abnormal for her to have trouble breathing.  She is a non-smoker, not currently on any hormonal contraceptives, no history of recent surgeries, sedentary behavior, past DVT issues.   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 remarks that she has "stomach problem", they haven't figured out what the problem is yet.  Mother states it seems to be associated with her period.      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   Blurred vision, bilateral    Major depressive disorder,  recurrent, severe without psychotic features ([place]) 02/20/2015   Morbid obesity ([place]) 02/19/2015   Hypertension 11/12/2014   Adjustment reaction with anxious mood    Nausea with vomiting    Menstrual irregularity 09/19/2014   Chronic abdominal pain 09/13/2014   Depression 12/15/2012    Past Surgical History:  Procedure Laterality Date   I & D EXTREMITY Right 10/20/2014   Procedure: IRRIGATION AND DEBRIDEMENT EXTREMITY WITH ANTIBOTIC BEADS;  Surgeon: Meredith Pel, MD;  Location: [facility];  Service: Orthopedics;  Laterality: Right;   ORIF ANKLE FRACTURE Right 08/27/2014   Procedure: OPEN REDUCTION INTERNAL FIXATION (ORIF) ANKLE FRACTURE;  Surgeon: Meredith Pel, MD;  Location: [facility];  Service: Orthopedics;  Laterality: Right;    OB History   No obstetric history on file.      Home Medications    Prior to Admission medications   Medication Sig Start Date End Date Taking? Authorizing Provider  ARIPiprazole (ABILIFY) 10 MG tablet Take 1 tablet (10 mg total) by mouth at bedtime. Patient not taking: Reported on 01/07/2020 06/10/15   Leonides Grills, MD  famotidine (PEPCID) 20 MG tablet Take 1 tablet (20 mg total) by mouth daily. 01/07/20   Rayna Sexton, PA-C  hydrochlorothiazide (HYDRODIURIL) 12.5 MG tablet Take 1 tablet (12.5 mg total) by mouth daily. Patient not taking: Reported on 01/07/2020 11/10/15   McKeag, Marylynn Pearson, MD  metFORMIN (GLUCOPHAGE) 500 MG tablet Take 1 tablet (500 mg total)  by mouth 2 (two) times daily with a meal. Patient not taking: Reported on 01/07/2020 11/10/15   McKeag, Marylynn Pearson, MD  sertraline (ZOLOFT) 50 MG tablet Take 3 tablets (150 mg total) by mouth daily. Patient not taking: Reported on 01/07/2020 06/10/15   Leonides Grills, MD  sucralfate (CARAFATE) 1 g tablet Take 1 tablet (1 g total) by mouth 4 (four) times daily -  with meals and at bedtime. 01/07/20   Rayna Sexton, PA-C    Family History Family History  Problem Relation Age of Onset    Alcohol abuse Mother    Depression Mother    Mental illness Mother    Alcohol abuse Father    Depression Sister    Alcohol abuse Maternal Grandmother    Mental illness Maternal Grandmother     Social History Social History   Tobacco Use   Smoking status: Never    Passive exposure: Yes   Smokeless tobacco: Never  Substance Use Topics   Alcohol use: No    Comment: occasional drinker and THC use per record hx   Drug use: No    Types: Marijuana    Comment: THC smoked in 7th grade, stopped THC in 8th grade 2 blunts per record hx     Allergies   Patient has no known allergies.   Review of Systems Review of Systems Per HPI  Physical Exam Triage Vital Signs ED Triage Vitals  Enc Vitals Group     BP 09/26/20 1411 (!) 149/98     Pulse Rate 09/26/20 1411 (!) 108     Resp 09/26/20 1411 18     Temp 09/26/20 1411 99.3 F (37.4 C)     Temp src --      SpO2 09/26/20 1411 97 %     Weight --      Height --      Head Circumference --      Peak Flow --      Pain Score 09/26/20 1413 4     Pain Loc --      Pain Edu? --      Excl. in [place]? --    No data found.  Updated Vital Signs BP 114/61   Pulse 79   Temp 99.3 F (37.4 C)   Resp 14   LMP  (LMP Unknown) Comment: PT does not know  SpO2 99%   Visual Acuity Right Eye Distance:   Left Eye Distance:   Bilateral Distance:    Right Eye Near:   Left Eye Near:    Bilateral Near:     Physical Exam Vitals and nursing note reviewed.  Constitutional:      Appearance: Normal appearance.     Comments: Appears uncomfortable  HENT:     Head: Atraumatic.     Right Ear: Tympanic membrane normal.     Left Ear: Tympanic membrane normal.     Nose: Nose normal.     Mouth/Throat:     Mouth: Mucous membranes are moist.     Pharynx: Posterior oropharyngeal erythema present.  Eyes:     Extraocular Movements: Extraocular movements intact.     Conjunctiva/sclera: Conjunctivae normal.  Cardiovascular:     Rate and Rhythm:  Regular rhythm. Tachycardia present.     Heart sounds: Normal heart sounds.  Pulmonary:     Effort: Pulmonary effort is normal.     Comments: Mildly diminished breath sounds throughout no obvious wheezes or rales Musculoskeletal:        General:  No swelling or tenderness. Normal range of motion.     Cervical back: Normal range of motion and neck supple.     Comments: Negative Bevelyn Buckles' sign bilateral lower extremities  Skin:    General: Skin is warm and dry.  Neurological:     Mental Status: She is alert and oriented to person, place, and time.  Psychiatric:        Mood and Affect: Mood normal.        Thought Content: Thought content normal.        Judgment: Judgment normal.   -physical exam very limited today as decision was already made with patient to go to the emergency department for further evaluation.   UC Treatments / Results  Labs (all labs ordered are listed, but only abnormal results are displayed) Labs Reviewed  BASIC METABOLIC PANEL - Abnormal; Notable for the following components:      Result Value   Glucose, Bld 182 (*)    All other components within normal limits  CBC  I-STAT BETA HCG BLOOD, ED ([facility], [facility], [facility] ONLY)  TROPONIN I (HIGH SENSITIVITY)  TROPONIN I (HIGH SENSITIVITY)    EKG   Radiology DG Chest Port 1 View  Result Date: 09/26/2020 CLINICAL DATA:  Shortness of breath and chest pain.  COVID positive. EXAM: PORTABLE CHEST 1 VIEW COMPARISON:  11/04/2019 FINDINGS: The cardiomediastinal silhouette is unremarkable. There is no evidence of focal airspace disease, pulmonary edema, suspicious pulmonary nodule/mass, pleural effusion, or pneumothorax. No acute bony abnormalities are identified. IMPRESSION: No active disease. Electronically Signed   By: Margarette Canada M.D.   On: 09/26/2020 15:09    Procedures Procedures (including critical care time)  Medications Ordered in UC Medications  sodium chloride 0.9 % bolus 1,000 mL (0 mLs Intravenous Stopped 09/26/20 1813)   albuterol (VENTOLIN HFA) 108 (90 Base) MCG/ACT inhaler 2 puff (2 puffs Inhalation Given 09/26/20 1645)  acetaminophen (TYLENOL) tablet 650 mg (650 mg Oral Given 09/26/20 1645)    Initial Impression / Assessment and Plan / UC Course  I have reviewed the triage vital signs and the nursing notes.  Pertinent labs & imaging results that were available during my care of the patient were reviewed by me and considered in my medical decision making (see chart for details).     Though her symptoms could represent classic COVID symptoms, given the localized nature of her chest pain, her concomitant shortness of breath and tachycardia between 102 to 110 bpm cannot rule out more emergent etiology such as a pulmonary embolism today to be the cause of her new onset symptoms.  Patient is agreeable to going to the emergency department via private vehicle as she has someone here to transport her for further evaluation to ensure no emergent etiology of her symptoms today.  She is hemodynamically stable for transport at this time.  Final Clinical Impressions(s) / UC Diagnoses   Final diagnoses:  COVID-19  Chest pain, unspecified type  Tachycardia  Shortness of breath     Discharge Instructions      You have COVID so please stay home per guidelines  Use albuterol as needed for shortness of breath  Take Tylenol and Motrin for fever  See your doctor  Return to the ER for worsening shortness of breath and chest pain and fevers     Person Under Monitoring Name: [name]  Location: [place] [address]   Infection Prevention Recommendations for Individuals Confirmed to have, or Being Evaluated  for, 2019 Novel Coronavirus (COVID-19) Infection Who Receive Care at Home  Individuals who are confirmed to have, or are being evaluated for, COVID-19 should follow the prevention steps below until a healthcare provider or local or state health department says they  can return to normal activities.  Stay home except to get medical care You should restrict activities outside your home, except for getting medical care. Do not go to work, school, or public areas, and do not use public transportation or taxis.  Call ahead before visiting your doctor Before your medical appointment, call the healthcare provider and tell them that you have, or are being evaluated for, COVID-19 infection. This will help the healthcare provider's office take steps to keep other people from getting infected. Ask your healthcare provider to call the local or state health department.  Monitor your symptoms Seek prompt medical attention if your illness is worsening (e.g., difficulty breathing). Before going to your medical appointment, call the healthcare provider and tell them that you have, or are being evaluated for, COVID-19 infection. Ask your healthcare provider to call the local or state health department.  Wear a facemask You should wear a facemask that covers your nose and mouth when you are in the same room with other people and when you visit a healthcare provider. People who live with or visit you should also wear a facemask while they are in the same room with you.  Separate yourself from other people in your home As much as possible, you should stay in a different room from other people in your home. Also, you should use a separate bathroom, if available.  Avoid sharing household items You should not share dishes, drinking glasses, cups, eating utensils, towels, bedding, or other items with other people in your home. After using these items, you should wash them thoroughly with soap and water.  Cover your coughs and sneezes Cover your mouth and nose with a tissue when you cough or sneeze, or you can cough or sneeze into your sleeve. Throw used tissues in a lined trash can, and immediately wash your hands with soap and water for at least 20 seconds or use  an alcohol-based hand rub.  Wash your [facility] your hands often and thoroughly with soap and water for at least 20 seconds. You can use an alcohol-based hand sanitizer if soap and water are not available and if your hands are not visibly dirty. Avoid touching your eyes, nose, and mouth with unwashed hands.   Prevention Steps for Caregivers and Household Members of Individuals Confirmed to have, or Being Evaluated for, COVID-19 Infection Being Cared for in the Home  If you live with, or provide care at home for, a person confirmed to have, or being evaluated for, COVID-19 infection please follow these guidelines to prevent infection:  Follow healthcare provider's instructions Make sure that you understand and can help the patient follow any healthcare provider instructions for all care.  Provide for the patient's basic needs You should help the patient with basic needs in the home and provide support for getting groceries, prescriptions, and other personal needs.  Monitor the patient's symptoms If they are getting sicker, call his or her medical provider and tell them that the patient has, or is being evaluated for, COVID-19 infection. This will help the healthcare provider's office take steps to keep other people from getting infected. Ask the healthcare provider to call the local or state health department.  Limit the number of people who have  contact with the patient If possible, have only one caregiver for the patient. Other household members should stay in another home or place of residence. If this is not possible, they should stay in another room, or be separated from the patient as much as possible. Use a separate bathroom, if available. Restrict visitors who do not have an essential need to be in the home.  Keep older adults, very young children, and other sick people away from the patient Keep older adults, very young children, and those who have compromised immune  systems or chronic health conditions away from the patient. This includes people with chronic heart, lung, or kidney conditions, diabetes, and cancer.  Ensure good ventilation Make sure that shared spaces in the home have good air flow, such as from an air conditioner or an opened window, weather permitting.  Wash your hands often Wash your hands often and thoroughly with soap and water for at least 20 seconds. You can use an alcohol based hand sanitizer if soap and water are not available and if your hands are not visibly dirty. Avoid touching your eyes, nose, and mouth with unwashed hands. Use disposable paper towels to dry your hands. If not available, use dedicated cloth towels and replace them when they become wet.  Wear a facemask and gloves Wear a disposable facemask at all times in the room and gloves when you touch or have contact with the patient's blood, body fluids, and/or secretions or excretions, such as sweat, saliva, sputum, nasal mucus, vomit, urine, or feces.  Ensure the mask fits over your nose and mouth tightly, and do not touch it during use. Throw out disposable facemasks and gloves after using them. Do not reuse. Wash your hands immediately after removing your facemask and gloves. If your personal clothing becomes contaminated, carefully remove clothing and launder. Wash your hands after handling contaminated clothing. Place all used disposable facemasks, gloves, and other waste in a lined container before disposing them with other household waste. Remove gloves and wash your hands immediately after handling these items.  Do not share dishes, glasses, or other household items with the patient Avoid sharing household items. You should not share dishes, drinking glasses, cups, eating utensils, towels, bedding, or other items with a patient who is confirmed to have, or being evaluated for, COVID-19 infection. After the person uses these items, you should wash them thoroughly  with soap and water.  Wash laundry thoroughly Immediately remove and wash clothes or bedding that have blood, body fluids, and/or secretions or excretions, such as sweat, saliva, sputum, nasal mucus, vomit, urine, or feces, on them. Wear gloves when handling laundry from the patient. Read and follow directions on labels of laundry or clothing items and detergent. In general, wash and dry with the warmest temperatures recommended on the label.  Clean all areas the individual has used often Clean all touchable surfaces, such as counters, tabletops, doorknobs, bathroom fixtures, toilets, phones, keyboards, tablets, and bedside tables, every day. Also, clean any surfaces that may have blood, body fluids, and/or secretions or excretions on them. Wear gloves when cleaning surfaces the patient has come in contact with. Use a diluted bleach solution (e.g., dilute bleach with 1 part bleach and 10 parts water) or a household disinfectant with a label that says EPA-registered for coronaviruses. To make a bleach solution at home, add 1 tablespoon of bleach to 1 quart (4 cups) of water. For a larger supply, add  cup of bleach to 1 gallon (16 cups)  of water. Read labels of cleaning products and follow recommendations provided on product labels. Labels contain instructions for safe and effective use of the cleaning product including precautions you should take when applying the product, such as wearing gloves or eye protection and making sure you have good ventilation during use of the product. Remove gloves and wash hands immediately after cleaning.  Monitor yourself for signs and symptoms of illness Caregivers and household members are considered close contacts, should monitor their health, and will be asked to limit movement outside of the home to the extent possible. Follow the monitoring steps for close contacts listed on the symptom monitoring form.   ? If you have additional questions, contact your  local health department or call the epidemiologist on call at [phone] (available 24/7). ? This guidance is subject to change. For the most up-to-date guidance from [facility], please refer to their website: YouBlogs.pl      ED Prescriptions   None    PDMP not reviewed this encounter.   Volney American, [place] 09/26/20 1859

## 2020-09-26 NOTE — Discharge Instructions (Addendum)
You have COVID so please stay home per guidelines  Use albuterol as needed for shortness of breath  Take Tylenol and Motrin for fever  See your doctor  Return to the ER for worsening shortness of breath and chest pain and fevers     Person Under Monitoring Name: Megan Trevino  Location: [place] [id number]   Infection Prevention Recommendations for Individuals Confirmed to have, or Being Evaluated for, 2019 Novel Coronavirus (COVID-19) Infection Who Receive Care at Home  Individuals who are confirmed to have, or are being evaluated for, COVID-19 should follow the prevention steps below until a healthcare provider or local or state health department says they can return to normal activities.  Stay home except to get medical care You should restrict activities outside your home, except for getting medical care. Do not go to work, school, or public areas, and do not use public transportation or taxis.  Call ahead before visiting your doctor Before your medical appointment, call the healthcare provider and tell them that you have, or are being evaluated for, COVID-19 infection. This will help the healthcare provider's office take steps to keep other people from getting infected. Ask your healthcare provider to call the local or state health department.  Monitor your symptoms Seek prompt medical attention if your illness is worsening (e.g., difficulty breathing). Before going to your medical appointment, call the healthcare provider and tell them that you have, or are being evaluated for, COVID-19 infection. Ask your healthcare provider to call the local or state health department.  Wear a facemask You should wear a facemask that covers your nose and mouth when you are in the same room with other people and when you visit a healthcare provider. People who live with or visit you should also wear a facemask while they are in the same room with  you.  Separate yourself from other people in your home As much as possible, you should stay in a different room from other people in your home. Also, you should use a separate bathroom, if available.  Avoid sharing household items You should not share dishes, drinking glasses, cups, eating utensils, towels, bedding, or other items with other people in your home. After using these items, you should wash them thoroughly with soap and water.  Cover your coughs and sneezes Cover your mouth and nose with a tissue when you cough or sneeze, or you can cough or sneeze into your sleeve. Throw used tissues in a lined trash can, and immediately wash your hands with soap and water for at least 20 seconds or use an alcohol-based hand rub.  Wash your [facility] your hands often and thoroughly with soap and water for at least 20 seconds. You can use an alcohol-based hand sanitizer if soap and water are not available and if your hands are not visibly dirty. Avoid touching your eyes, nose, and mouth with unwashed hands.   Prevention Steps for Caregivers and Household Members of Individuals Confirmed to have, or Being Evaluated for, COVID-19 Infection Being Cared for in the Home  If you live with, or provide care at home for, a person confirmed to have, or being evaluated for, COVID-19 infection please follow these guidelines to prevent infection:  Follow healthcare provider's instructions Make sure that you understand and can help the patient follow any healthcare provider instructions for all care.  Provide for the patient's basic needs You should help the patient with basic needs in the home and  provide support for getting groceries, prescriptions, and other personal needs.  Monitor the patient's symptoms If they are getting sicker, call his or her medical provider and tell them that the patient has, or is being evaluated for, COVID-19 infection. This will help the healthcare provider's office  take steps to keep other people from getting infected. Ask the healthcare provider to call the local or state health department.  Limit the number of people who have contact with the patient If possible, have only one caregiver for the patient. Other household members should stay in another home or place of residence. If this is not possible, they should stay in another room, or be separated from the patient as much as possible. Use a separate bathroom, if available. Restrict visitors who do not have an essential need to be in the home.  Keep older adults, very young children, and other sick people away from the patient Keep older adults, very young children, and those who have compromised immune systems or chronic health conditions away from the patient. This includes people with chronic heart, lung, or kidney conditions, diabetes, and cancer.  Ensure good ventilation Make sure that shared spaces in the home have good air flow, such as from an air conditioner or an opened window, weather permitting.  Wash your hands often Wash your hands often and thoroughly with soap and water for at least 20 seconds. You can use an alcohol based hand sanitizer if soap and water are not available and if your hands are not visibly dirty. Avoid touching your eyes, nose, and mouth with unwashed hands. Use disposable paper towels to dry your hands. If not available, use dedicated cloth towels and replace them when they become wet.  Wear a facemask and gloves Wear a disposable facemask at all times in the room and gloves when you touch or have contact with the patient's blood, body fluids, and/or secretions or excretions, such as sweat, saliva, sputum, nasal mucus, vomit, urine, or feces.  Ensure the mask fits over your nose and mouth tightly, and do not touch it during use. Throw out disposable facemasks and gloves after using them. Do not reuse. Wash your hands immediately after removing your facemask and  gloves. If your personal clothing becomes contaminated, carefully remove clothing and launder. Wash your hands after handling contaminated clothing. Place all used disposable facemasks, gloves, and other waste in a lined container before disposing them with other household waste. Remove gloves and wash your hands immediately after handling these items.  Do not share dishes, glasses, or other household items with the patient Avoid sharing household items. You should not share dishes, drinking glasses, cups, eating utensils, towels, bedding, or other items with a patient who is confirmed to have, or being evaluated for, COVID-19 infection. After the person uses these items, you should wash them thoroughly with soap and water.  Wash laundry thoroughly Immediately remove and wash clothes or bedding that have blood, body fluids, and/or secretions or excretions, such as sweat, saliva, sputum, nasal mucus, vomit, urine, or feces, on them. Wear gloves when handling laundry from the patient. Read and follow directions on labels of laundry or clothing items and detergent. In general, wash and dry with the warmest temperatures recommended on the label.  Clean all areas the individual has used often Clean all touchable surfaces, such as counters, tabletops, doorknobs, bathroom fixtures, toilets, phones, keyboards, tablets, and bedside tables, every day. Also, clean any surfaces that may have blood, body fluids, and/or secretions or  excretions on them. Wear gloves when cleaning surfaces the patient has come in contact with. Use a diluted bleach solution (e.g., dilute bleach with 1 part bleach and 10 parts water) or a household disinfectant with a label that says EPA-registered for coronaviruses. To make a bleach solution at home, add 1 tablespoon of bleach to 1 quart (4 cups) of water. For a larger supply, add  cup of bleach to 1 gallon (16 cups) of water. Read labels of cleaning products and follow  recommendations provided on product labels. Labels contain instructions for safe and effective use of the cleaning product including precautions you should take when applying the product, such as wearing gloves or eye protection and making sure you have good ventilation during use of the product. Remove gloves and wash hands immediately after cleaning.  Monitor yourself for signs and symptoms of illness Caregivers and household members are considered close contacts, should monitor their health, and will be asked to limit movement outside of the home to the extent possible. Follow the monitoring steps for close contacts listed on the symptom monitoring form.   ? If you have additional questions, contact your local health department or call the epidemiologist on call at [phone] (available 24/7). ? This guidance is subject to change. For the most up-to-date guidance from [facility], please refer to their website: [facility]

## 2021-08-12 ENCOUNTER — Emergency Department (HOSPITAL_BASED_OUTPATIENT_CLINIC_OR_DEPARTMENT_OTHER)
Admission: EM | Admit: 2021-08-12 | Discharge: 2021-08-12 | Disposition: A | Discharge status: Home / Self Care | Payer: Self-pay | Attending: Emergency Medicine | Admitting: Emergency Medicine

## 2021-08-12 ENCOUNTER — Other Ambulatory Visit: Payer: Self-pay

## 2021-08-12 ENCOUNTER — Encounter (HOSPITAL_BASED_OUTPATIENT_CLINIC_OR_DEPARTMENT_OTHER): Payer: Self-pay | Admitting: Obstetrics and Gynecology

## 2021-08-12 DIAGNOSIS — R519 Headache, unspecified: Secondary | ICD-10-CM | POA: Insufficient documentation

## 2021-08-12 DIAGNOSIS — E119 Type 2 diabetes mellitus without complications: Secondary | ICD-10-CM | POA: Insufficient documentation

## 2021-08-12 DIAGNOSIS — Z7984 Long term (current) use of oral hypoglycemic drugs: Secondary | ICD-10-CM | POA: Insufficient documentation

## 2021-08-12 DIAGNOSIS — N9489 Other specified conditions associated with female genital organs and menstrual cycle: Secondary | ICD-10-CM | POA: Insufficient documentation

## 2021-08-12 DIAGNOSIS — N921 Excessive and frequent menstruation with irregular cycle: Secondary | ICD-10-CM | POA: Insufficient documentation

## 2021-08-12 LAB — I-STAT VENOUS BLOOD GAS, ED
Acid-base deficit: 1 mmol/L (ref 0.0–2.0)
Bicarbonate: 23.9 mmol/L (ref 20.0–28.0)
Calcium, Ion: 1.25 mmol/L (ref 1.15–1.40)
HCT: 41 % (ref 36.0–46.0)
Hemoglobin: 13.9 g/dL (ref 12.0–15.0)
O2 Saturation: 73 %
Patient temperature: 37
Potassium: 3.9 mmol/L (ref 3.5–5.1)
Sodium: 138 mmol/L (ref 135–145)
TCO2: 25 mmol/L (ref 22–32)
pCO2, Ven: 38.8 mmHg — ABNORMAL LOW (ref 44–60)
pH, Ven: 7.398 (ref 7.25–7.43)
pO2, Ven: 39 mmHg (ref 32–45)

## 2021-08-12 LAB — BASIC METABOLIC PANEL
Anion gap: 18 — ABNORMAL HIGH (ref 5–15)
BUN: 10 mg/dL (ref 6–20)
CO2: 16 mmol/L — ABNORMAL LOW (ref 22–32)
Calcium: 10.3 mg/dL (ref 8.9–10.3)
Chloride: 102 mmol/L (ref 98–111)
Creatinine, Ser: 0.62 mg/dL (ref 0.44–1.00)
GFR, Estimated: >60 mL/min (ref >60)
Glucose, Bld: 291 mg/dL — ABNORMAL HIGH (ref 70–99)
Potassium: 3.8 mmol/L (ref 3.5–5.1)
Sodium: 136 mmol/L (ref 135–145)

## 2021-08-12 LAB — CBC
HCT: 42.1 % (ref 36.0–46.0)
Hemoglobin: 14 g/dL (ref 12.0–15.0)
MCH: 30 pg (ref 26.0–34.0)
MCHC: 33.3 g/dL (ref 30.0–36.0)
MCV: 90.3 fL (ref 80.0–100.0)
Platelets: 308 10*3/uL (ref 150–400)
RBC: 4.66 MIL/uL (ref 3.87–5.11)
RDW: 11.9 % (ref 11.5–15.5)
WBC: 6.9 10*3/uL (ref 4.0–10.5)
nRBC: 0 % (ref 0.0–0.2)

## 2021-08-12 LAB — URINALYSIS, ROUTINE W REFLEX MICROSCOPIC
Bilirubin Urine: NEGATIVE
Glucose, UA: >1000 mg/dL — AB
Ketones, ur: 15 mg/dL — AB
Leukocytes,Ua: NEGATIVE
Nitrite: NEGATIVE
Protein, ur: 30 mg/dL — AB
RBC / HPF: >50 RBC/hpf — ABNORMAL HIGH (ref 0–5)
Specific Gravity, Urine: 1.031 — ABNORMAL HIGH (ref 1.005–1.030)
pH: 7.5 (ref 5.0–8.0)

## 2021-08-12 LAB — HCG, QUANTITATIVE, PREGNANCY: hCG, Beta Chain, Quant, S: <1 m[IU]/mL (ref <5)

## 2021-08-12 LAB — CBG MONITORING, ED: Glucose-Capillary: 254 mg/dL — ABNORMAL HIGH (ref 70–99)

## 2021-08-12 MED ORDER — KETOROLAC TROMETHAMINE 15 MG/ML IJ SOLN
15.0000 mg | Freq: Once | INTRAMUSCULAR | Status: AC
  Administered 2021-08-12: 15 mg via INTRAVENOUS
  Filled 2021-08-12: qty 1

## 2021-08-12 MED ORDER — DEXAMETHASONE SODIUM PHOSPHATE 10 MG/ML IJ SOLN
10.0000 mg | Freq: Once | INTRAMUSCULAR | Status: AC
  Administered 2021-08-12: 10 mg via INTRAVENOUS
  Filled 2021-08-12: qty 1

## 2021-08-12 MED ORDER — SODIUM CHLORIDE 0.9 % IV BOLUS
1000.0000 mL | Freq: Once | INTRAVENOUS | Status: AC
  Administered 2021-08-12: 1000 mL via INTRAVENOUS

## 2021-08-12 MED ORDER — PROCHLORPERAZINE EDISYLATE 10 MG/2ML IJ SOLN
10.0000 mg | Freq: Once | INTRAMUSCULAR | Status: AC
  Administered 2021-08-12: 10 mg via INTRAVENOUS
  Filled 2021-08-12: qty 2

## 2021-08-12 MED ORDER — DIPHENHYDRAMINE HCL 50 MG/ML IJ SOLN
50.0000 mg | Freq: Once | INTRAMUSCULAR | Status: AC
  Administered 2021-08-12: 50 mg via INTRAVENOUS
  Filled 2021-08-12: qty 1

## 2021-08-12 NOTE — ED Notes (Signed)
RT note: Venous Blood Gas drawn by RN, resulted by RT.

## 2021-08-12 NOTE — Discharge Instructions (Addendum)
You were seen today due to a headache and shortness of breath.  Your work-up shows signs that you may be diabetic.  You need follow-up with your primary care provider for management of this potential condition.  I provided contact information for a [facility] group that can help you establish primary care.  Continue to take over-the-counter medicines for your headache as needed.  There were no signs of pneumonia or other urgent issues in your work-up to cause her shortness of breath which has now resolved.

## 2021-08-12 NOTE — ED Provider Notes (Signed)
[facility] EMERGENCY DEPT Provider Note   CSN: [id number] Arrival date & time: 08/12/21  1051     History  Chief Complaint  Patient presents with   Dizziness    Megan Trevino is a 21 y.o. female.  Patient presents to the hospital via EMS from work complaining of headache, shortness of breath, and anxiety.  Patient states that she has had a heavier than usual menstrual period over the past 5 months.  The patient states that she was having heavy flow this morning and began to have the symptoms at that time.  Denies chest pain, abdominal pain, dysuria.  Endorses shortness of breath, vaginal bleeding, anxiety, headache.  Of note EMS reported capillary blood glucose of over 300.  Past medical history significant for depression, obesity, bipolar 2 disorder, anxiety, headache, irregular menses  HPI     Home Medications Prior to Admission medications   Medication Sig Start Date End Date Taking? Authorizing Provider  ARIPiprazole (ABILIFY) 10 MG tablet Take 1 tablet (10 mg total) by mouth at bedtime. Patient not taking: Reported on 01/07/2020 06/10/15   Leonides Grills, MD  famotidine (PEPCID) 20 MG tablet Take 1 tablet (20 mg total) by mouth daily. 01/07/20   Rayna Sexton, PA-C  hydrochlorothiazide (HYDRODIURIL) 12.5 MG tablet Take 1 tablet (12.5 mg total) by mouth daily. Patient not taking: Reported on 01/07/2020 11/10/15   McKeag, Marylynn Pearson, MD  metFORMIN (GLUCOPHAGE) 500 MG tablet Take 1 tablet (500 mg total) by mouth 2 (two) times daily with a meal. Patient not taking: Reported on 01/07/2020 11/10/15   McKeag, Marylynn Pearson, MD  sertraline (ZOLOFT) 50 MG tablet Take 3 tablets (150 mg total) by mouth daily. Patient not taking: Reported on 01/07/2020 06/10/15   Leonides Grills, MD  sucralfate (CARAFATE) 1 g tablet Take 1 tablet (1 g total) by mouth 4 (four) times daily -  with meals and at bedtime. 01/07/20   Rayna Sexton, PA-C      Allergies    Oxycodone     Review of Systems   Review of Systems  Constitutional:  Positive for fatigue. Negative for fever.  Respiratory:  Positive for shortness of breath.   Cardiovascular:  Negative for chest pain.  Gastrointestinal:  Negative for abdominal pain, nausea and vomiting.  Endocrine: Negative for polydipsia, polyphagia and polyuria.  Genitourinary:  Positive for vaginal bleeding.  Neurological:  Positive for headaches.   Physical Exam Updated Vital Signs BP 119/66   Pulse 81   Temp 97.8 F (36.6 C)   Resp 16   Ht '5\' 9"'$  (1.753 m)   SpO2 100%   BMI 42.83 kg/m  Physical Exam Vitals and nursing note reviewed.  Constitutional:      General: She is not in acute distress.    Appearance: She is obese.  HENT:     Head: Normocephalic and atraumatic.  Eyes:     Extraocular Movements: Extraocular movements intact.     Conjunctiva/sclera: Conjunctivae normal.     Pupils: Pupils are equal, round, and reactive to light.  Cardiovascular:     Rate and Rhythm: Normal rate and regular rhythm.     Pulses: Normal pulses.     Heart sounds: Normal heart sounds.  Pulmonary:     Effort: Pulmonary effort is normal.     Breath sounds: Normal breath sounds.  Abdominal:     Palpations: Abdomen is soft.     Tenderness: There is no abdominal tenderness.  Genitourinary:    Comments:  Deferred Musculoskeletal:        General: Normal range of motion.     Cervical back: Normal range of motion and neck supple.     Right lower leg: No edema.     Left lower leg: No edema.  Skin:    General: Skin is warm and dry.     Capillary Refill: Capillary refill takes less than 2 seconds.  Neurological:     General: No focal deficit present.     Mental Status: She is alert and oriented to person, place, and time.    ED Results / Procedures / Treatments   Labs (all labs ordered are listed, but only abnormal results are displayed) Labs Reviewed  BASIC METABOLIC PANEL - Abnormal; Notable for the following components:       Result Value   CO2 16 (*)    Glucose, Bld 291 (*)    Anion gap 18 (*)    All other components within normal limits  URINALYSIS, ROUTINE W REFLEX MICROSCOPIC - Abnormal; Notable for the following components:   Color, Urine BROWN (*)    Specific Gravity, Urine 1.031 (*)    Glucose, UA >1,000 (*)    Hgb urine dipstick LARGE (*)    Ketones, ur 15 (*)    Protein, ur 30 (*)    RBC / HPF >50 (*)    All other components within normal limits  CBG MONITORING, ED - Abnormal; Notable for the following components:   Glucose-Capillary 254 (*)    All other components within normal limits  I-STAT VENOUS BLOOD GAS, ED - Abnormal; Notable for the following components:   pCO2, Ven 38.8 (*)    All other components within normal limits  CBC  HCG, QUANTITATIVE, PREGNANCY    EKG EKG Interpretation  Date/Time:  Friday August 12 2021 11:53:32 EDT Ventricular Rate:  85 PR Interval:  178 QRS Duration: 82 QT Interval:  366 QTC Calculation: 435 R Axis:   12 Text Interpretation: Normal sinus rhythm Normal ECG When compared with ECG of 26-Sep-2020 14:15, QRS axis Shifted left Confirmed by Wynona Dove ([id number]) on 08/12/2021 2:30:50 PM  Radiology No results found.  Procedures Procedures    Medications Ordered in ED Medications  ketorolac (TORADOL) 15 MG/ML injection 15 mg (15 mg Intravenous Given 08/12/21 1259)  prochlorperazine (COMPAZINE) injection 10 mg (10 mg Intravenous Given 08/12/21 1259)  sodium chloride 0.9 % bolus 1,000 mL (0 mLs Intravenous Stopped 08/12/21 1424)  dexamethasone (DECADRON) injection 10 mg (10 mg Intravenous Given 08/12/21 1424)  diphenhydrAMINE (BENADRYL) injection 50 mg (50 mg Intravenous Given 08/12/21 1424)    ED Course/ Medical Decision Making/ A&P                           Medical Decision Making Amount and/or Complexity of Data Reviewed Labs: ordered.  Risk Prescription drug management.   This patient presents to the ED for concern of headache, shortness of  breath, this involves an extensive number of treatment options, and is a complaint that carries with it a high risk of complications and morbidity.  The differential diagnosis includes anxiety, migraine, tension headache, and others   Co morbidities that complicate the patient evaluation  History of migraine, history of irregular periods   Additional history obtained:  Additional history obtained from patient's mother    Lab Tests:  I Ordered, and personally interpreted labs.  The pertinent results include: Venous PCO2, 38.8, glucose 291, anion gap 18,  urine brown with a specific gravity of 1.031, glucose greater than 1000, 15 ketones   Cardiac Monitoring: / EKG:  The patient was maintained on a cardiac monitor.  I personally viewed and interpreted the cardiac monitored which showed an underlying rhythm of: Normal sinus rhythm   Problem List / ED Course / Critical interventions / Medication management        I ordered medication including dexamethasone, Benadryl, Toradol, Compazine for headache, saline for possible dehydration Reevaluation of the patient after these medicines showed that the patient improved I have reviewed the patients home medicines and have made adjustments as needed   Social Determinants of Health:  No PCP   Test / Admission - Considered:  [name] headache improved significantly after the medication was administered.  She had no shortness of breath after the first medication was administered.  No dizziness at this time.  She states this feels like previous headaches/migraines that she has had.  This is likely a repeat of the same  As to the patient's heavy periods she will need to follow-up with primary care.  This is an ongoing problem which is been going on for 5 months at this time.  Doubt anything emergent at this time  The patient does have elevated glucose and states that she has not eaten today.  Based on criteria this would make her  positive for type 2 diabetes.  Patient needs to follow-up with primary care to begin treatment for same.  Plan to discharge patient home with information on establishing primary care.  Discussed importance of this with patient and she agrees.  Final Clinical Impression(s) / ED Diagnoses Final diagnoses:  Nonintractable headache, unspecified chronicity pattern, unspecified headache type  Menorrhagia with irregular cycle    Rx / DC Orders ED Discharge Orders     None         Ronny Bacon 08/12/21 [place], DO 08/15/21 2019

## 2021-08-12 NOTE — ED Notes (Signed)
Patient has an elevated blood glucose and gap but has no diagnosis of diabetes. She is on no treatment for diabetes at this time.

## 2021-08-12 NOTE — ED Triage Notes (Signed)
Patient reports to the ER for anxiety, headache, dizziness. Patient was at work when this happened. Reports drama at work but other than that no other precipitating factors.

## 2021-08-12 NOTE — ED Notes (Signed)
Discharge paperwork given and understood. 

## 2021-09-15 ENCOUNTER — Ambulatory Visit (HOSPITAL_BASED_OUTPATIENT_CLINIC_OR_DEPARTMENT_OTHER): Payer: Self-pay | Admitting: Nurse Practitioner

## 2021-09-30 ENCOUNTER — Encounter (HOSPITAL_BASED_OUTPATIENT_CLINIC_OR_DEPARTMENT_OTHER): Payer: Self-pay | Admitting: Nurse Practitioner

## 2022-07-18 ENCOUNTER — Other Ambulatory Visit: Payer: Self-pay

## 2022-07-18 ENCOUNTER — Emergency Department (HOSPITAL_COMMUNITY)
Admission: EM | Admit: 2022-07-18 | Discharge: 2022-07-18 | Disposition: A | Discharge status: Home / Self Care | Payer: Self-pay | Attending: Emergency Medicine | Admitting: Emergency Medicine

## 2022-07-18 ENCOUNTER — Encounter (HOSPITAL_COMMUNITY): Payer: Self-pay

## 2022-07-18 ENCOUNTER — Emergency Department (HOSPITAL_COMMUNITY): Payer: Self-pay

## 2022-07-18 DIAGNOSIS — Z1152 Encounter for screening for COVID-19: Secondary | ICD-10-CM | POA: Insufficient documentation

## 2022-07-18 DIAGNOSIS — R051 Acute cough: Secondary | ICD-10-CM | POA: Insufficient documentation

## 2022-07-18 LAB — SARS CORONAVIRUS 2 BY RT PCR: SARS Coronavirus 2 by RT PCR: NEGATIVE

## 2022-07-18 LAB — GROUP A STREP BY PCR: Group A Strep by PCR: NOT DETECTED

## 2022-07-18 MED ORDER — BENZONATATE 100 MG PO CAPS: 100.0000 mg | ORAL_CAPSULE | Freq: Three times a day (TID) | ORAL | 0 refills | Status: DC

## 2022-07-18 NOTE — ED Provider Notes (Signed)
[place] EMERGENCY DEPARTMENT AT [facility] Provider Note   CSN: [id number] Arrival date & time: 07/18/22  4098     History {Add pertinent medical, surgical, social history, OB history to HPI:1} Chief Complaint  Patient presents with   Cough    Megan Trevino is a 22 y.o. female with a past medical history as below presents today for evaluation of bloody sputum. Reports waking up this morning coughing up sputum with blood in it. Denies fever, chest pain, shortness of breath, nausea, vomiting, runny nose, abdominal pain, bowel changes, urinary symptoms.  Denies any personal or family history of DVT/PE.  No recent travel or surgery.  Patient is not taking any OCP.  No history of malignancy.   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 remarks that she has "stomach problem", they haven't figured out what the problem is yet.  Mother states it seems to be associated with her period.     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 remarks that she has "stomach problem", they haven't figured out what the problem is yet.  Mother states it seems to be associated with her period.       Home Medications Prior to Admission medications   Medication Sig Start Date End Date Taking? Authorizing Provider  ARIPiprazole (ABILIFY) 10 MG tablet Take 1 tablet  (10 mg total) by mouth at bedtime. Patient not taking: Reported on 01/07/2020 06/10/15   Gayland Curry, MD  famotidine (PEPCID) 20 MG tablet Take 1 tablet (20 mg total) by mouth daily. 01/07/20   Placido Sou, PA-C  hydrochlorothiazide (HYDRODIURIL) 12.5 MG tablet Take 1 tablet (12.5 mg total) by mouth daily. Patient not taking: Reported on 01/07/2020 11/10/15   McKeag, Janine Ores, MD  metFORMIN (GLUCOPHAGE) 500 MG tablet Take 1 tablet (500 mg total) by mouth 2 (two) times daily with a meal. Patient not taking: Reported on 01/07/2020 11/10/15   McKeag, Janine Ores, MD  sertraline (ZOLOFT) 50 MG tablet Take 3 tablets (150 mg total) by mouth daily. Patient not taking: Reported on 01/07/2020 06/10/15   Gayland Curry, MD  sucralfate (CARAFATE) 1 g tablet Take 1 tablet (1 g total) by mouth 4 (four) times daily -  with meals and at bedtime. 01/07/20   Placido Sou, PA-C      Allergies    Oxycodone    Review of Systems   Review of Systems  Respiratory:  Positive for cough.     Physical Exam Updated Vital Signs BP (!) 177/102 (BP Location: Right Arm)   Pulse 93   Temp 98.3 F (36.8 C) (Oral)   Resp 18   Ht 5\' 9"  (1.753 m)  Wt 130.2 kg   SpO2 96%   BMI 42.38 kg/m  Physical Exam Vitals and nursing note reviewed.  Constitutional:      Appearance: Normal appearance.  HENT:     Head: Normocephalic and atraumatic.     Mouth/Throat:     Mouth: Mucous membranes are moist.  Eyes:     General: No scleral icterus. Cardiovascular:     Rate and Rhythm: Normal rate and regular rhythm.     Pulses: Normal pulses.     Heart sounds: Normal heart sounds.  Pulmonary:     Effort: Pulmonary effort is normal.     Breath sounds: Normal breath sounds.  Abdominal:     General: Abdomen is flat.     Palpations: Abdomen is soft.     Tenderness: There is no abdominal tenderness.  Musculoskeletal:        General: No deformity.  Skin:    General: Skin is warm.     Findings: No rash.   Neurological:     General: No focal deficit present.     Mental Status: She is alert.  Psychiatric:        Mood and Affect: Mood normal.     ED Results / Procedures / Treatments   Labs (all labs ordered are listed, but only abnormal results are displayed) Labs Reviewed - No data to display  EKG None  Radiology No results found.  Procedures Procedures  {Document cardiac monitor, telemetry assessment procedure when appropriate:1}  Medications Ordered in ED Medications - No data to display  ED Course/ Medical Decision Making/ A&P   {   Click here for ABCD2, HEART and other calculatorsREFRESH Note before signing :1}                          Medical Decision Making Amount and/or Complexity of Data Reviewed Radiology: ordered.  Risk Prescription drug management.   This patient presents to the ED for sore throat, body aches, this involves an extensive number of treatment options, and is a complaint that carries with a high risk of complications and morbidity.  The differential diagnosis includes flu, COVID, RSV, strep, pharyngitis, bronchitis, pneumonia, infectious etiology.  This is not an exhaustive list.  Lab tests: I ordered and personally interpreted labs.  The pertinent results include: Strep negative.  Imaging studies:  Problem list/ ED course/ Critical interventions/ Medical management: HPI: See above Vital signs within normal range and stable throughout visit. Laboratory/imaging studies significant for: See above. On physical examination, patient is afebrile and appears in no acute distress. This patient presents with symptoms suspicious for viral upper respiratory infection. Based on history and physical doubt sinusitis. COVID test was negative. Do not suspect underlying cardiopulmonary process. I considered, but think unlikely, pneumonia. Patient is nontoxic appearing and not in need of emergent medical intervention. Patient told to self isolate at home until  symptoms subside for 72 hours. Recommended patient to take TheraFlu or Mucinex for symptom relief.  Follow-up with primary care physician for further evaluation and management.  Return to the ER if new or worsening symptoms. I have reviewed the patient home medicines and have made adjustments as needed.  Cardiac monitoring/EKG: The patient was maintained on a cardiac monitor.  I personally reviewed and interpreted the cardiac monitor which showed an underlying rhythm of: sinus rhythm.  Additional history obtained: External records from outside source obtained and reviewed including: Chart review including previous notes, labs, imaging.  Consultations obtained:  Disposition Continued outpatient therapy. Follow-up with PCP recommended for reevaluation of symptoms. Treatment plan discussed with patient.  Pt acknowledged understanding was agreeable to the plan. Worrisome signs and symptoms were discussed with patient, and patient acknowledged understanding to return to the ED if they noticed these signs and symptoms. Patient was stable upon discharge.   This chart was dictated using voice recognition software.  Despite best efforts to proofread,  errors can occur which can change the documentation meaning.    {Document critical care time when appropriate:1} {Document review of labs and clinical decision tools ie heart score, Chads2Vasc2 etc:1}  {Document your independent review of radiology images, and any outside records:1} {Document your discussion with family members, caretakers, and with consultants:1} {Document social determinants of health affecting pt's care:1} {Document your decision making why or why not admission, treatments were needed:1} Final Clinical Impression(s) / ED Diagnoses Final diagnoses:  None    Rx / DC Orders ED Discharge Orders     None

## 2022-07-18 NOTE — Discharge Instructions (Addendum)
Please take your medications as prescribed. Take tylenol/ibuprofen for pain. I recommend close follow-up with PCP for reevaluation.  Please do not hesitate to return to emergency department if worrisome signs symptoms we discussed become apparent.  

## 2022-07-18 NOTE — ED Triage Notes (Signed)
Patient felt a lump in her throat and coughed then bright blood came up when she spit it out. Has been feeling more fatigued and lightheaded lately.

## 2023-11-05 ENCOUNTER — Emergency Department (HOSPITAL_COMMUNITY): Payer: Self-pay

## 2023-11-05 ENCOUNTER — Emergency Department (HOSPITAL_COMMUNITY)
Admission: EM | Admit: 2023-11-05 | Discharge: 2023-11-05 | Disposition: A | Discharge status: Home / Self Care | Payer: Self-pay | Attending: Emergency Medicine | Admitting: Emergency Medicine

## 2023-11-05 ENCOUNTER — Other Ambulatory Visit: Payer: Self-pay

## 2023-11-05 ENCOUNTER — Encounter (HOSPITAL_COMMUNITY): Payer: Self-pay

## 2023-11-05 DIAGNOSIS — R197 Diarrhea, unspecified: Secondary | ICD-10-CM | POA: Insufficient documentation

## 2023-11-05 DIAGNOSIS — R112 Nausea with vomiting, unspecified: Secondary | ICD-10-CM | POA: Insufficient documentation

## 2023-11-05 DIAGNOSIS — R1010 Upper abdominal pain, unspecified: Secondary | ICD-10-CM | POA: Insufficient documentation

## 2023-11-05 LAB — COMPREHENSIVE METABOLIC PANEL WITH GFR
ALT: 59 U/L — ABNORMAL HIGH (ref 0–44)
AST: 35 U/L (ref 15–41)
Albumin: 4.3 g/dL (ref 3.5–5.0)
Alkaline Phosphatase: 69 U/L (ref 38–126)
Anion gap: 10 (ref 5–15)
BUN: 18 mg/dL (ref 6–20)
CO2: 22 mmol/L (ref 22–32)
Calcium: 9.4 mg/dL (ref 8.9–10.3)
Chloride: 103 mmol/L (ref 98–111)
Creatinine, Ser: 0.83 mg/dL (ref 0.44–1.00)
GFR, Estimated: >60 mL/min (ref >60)
Glucose, Bld: 367 mg/dL — ABNORMAL HIGH (ref 70–99)
Potassium: 4.2 mmol/L (ref 3.5–5.1)
Sodium: 135 mmol/L (ref 135–145)
Total Bilirubin: 1 mg/dL (ref 0.0–1.2)
Total Protein: 7.9 g/dL (ref 6.5–8.1)

## 2023-11-05 LAB — I-STAT CHEM 8, ED
BUN: 19 mg/dL (ref 6–20)
Calcium, Ion: 1.15 mmol/L (ref 1.15–1.40)
Chloride: 102 mmol/L (ref 98–111)
Creatinine, Ser: 0.8 mg/dL (ref 0.44–1.00)
Glucose, Bld: 375 mg/dL — ABNORMAL HIGH (ref 70–99)
HCT: 47 % — ABNORMAL HIGH (ref 36.0–46.0)
Hemoglobin: 16 g/dL — ABNORMAL HIGH (ref 12.0–15.0)
Potassium: 4.3 mmol/L (ref 3.5–5.1)
Sodium: 138 mmol/L (ref 135–145)
TCO2: 23 mmol/L (ref 22–32)

## 2023-11-05 LAB — URINALYSIS, ROUTINE W REFLEX MICROSCOPIC
Bacteria, UA: NONE SEEN
Bilirubin Urine: NEGATIVE
Glucose, UA: >=500 mg/dL — AB
Ketones, ur: 20 mg/dL — AB
Leukocytes,Ua: NEGATIVE
Nitrite: NEGATIVE
Protein, ur: NEGATIVE mg/dL
Specific Gravity, Urine: 1.035 — ABNORMAL HIGH (ref 1.005–1.030)
pH: 6 (ref 5.0–8.0)

## 2023-11-05 LAB — CBC
HCT: 45.6 % (ref 36.0–46.0)
Hemoglobin: 14.8 g/dL (ref 12.0–15.0)
MCH: 30.9 pg (ref 26.0–34.0)
MCHC: 32.5 g/dL (ref 30.0–36.0)
MCV: 95.2 fL (ref 80.0–100.0)
Platelets: 284 K/uL (ref 150–400)
RBC: 4.79 MIL/uL (ref 3.87–5.11)
RDW: 11.9 % (ref 11.5–15.5)
WBC: 14.3 K/uL — ABNORMAL HIGH (ref 4.0–10.5)
nRBC: 0 % (ref 0.0–0.2)

## 2023-11-05 LAB — HCG, SERUM, QUALITATIVE: Preg, Serum: NEGATIVE

## 2023-11-05 LAB — LIPASE, BLOOD: Lipase: 31 U/L (ref 11–51)

## 2023-11-05 MED ORDER — IOHEXOL 300 MG/ML  SOLN
100.0000 mL | Freq: Once | INTRAMUSCULAR | Status: AC | PRN
  Administered 2023-11-05: 100 mL via INTRAVENOUS

## 2023-11-05 MED ORDER — HYDROMORPHONE HCL 1 MG/ML IJ SOLN
1.0000 mg | Freq: Once | INTRAMUSCULAR | Status: AC
  Administered 2023-11-05: 1 mg via INTRAVENOUS
  Filled 2023-11-05: qty 1

## 2023-11-05 MED ORDER — DIPHENHYDRAMINE HCL 50 MG/ML IJ SOLN
25.0000 mg | Freq: Once | INTRAMUSCULAR | Status: AC
  Administered 2023-11-05: 25 mg via INTRAVENOUS
  Filled 2023-11-05: qty 1

## 2023-11-05 MED ORDER — METOCLOPRAMIDE HCL 5 MG/ML IJ SOLN
10.0000 mg | Freq: Once | INTRAMUSCULAR | Status: AC
  Administered 2023-11-05: 10 mg via INTRAVENOUS
  Filled 2023-11-05: qty 2

## 2023-11-05 MED ORDER — SODIUM CHLORIDE 0.9 % IV BOLUS
1000.0000 mL | Freq: Once | INTRAVENOUS | Status: AC
  Administered 2023-11-05: 1000 mL via INTRAVENOUS

## 2023-11-05 MED ORDER — ONDANSETRON 4 MG PO TBDP
ORAL_TABLET | ORAL | 0 refills | Status: AC
Start: 2023-11-05 — End: ?

## 2023-11-05 NOTE — ED Triage Notes (Signed)
 Abdominal pain that started around 0600 today. N/v/d with it. Pain goes into her back as well. Denies any problems urinating.

## 2023-11-05 NOTE — ED Provider Notes (Signed)
 [place] EMERGENCY DEPARTMENT AT [facility] Provider Note   CSN: [id number] Arrival date & time: 11/05/23  9097     Patient presents with: Abdominal Pain   Megan Trevino is a 22 y.o. female.   23 yo F with a cc of with a chief complaints of nausea vomiting and diarrhea.  Started abruptly about 3 hours ago.  No dark or bloody stool.  Upper abdominal discomfort.  No known sick contacts.  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  She denies recent international travel.     Abdominal Pain      Prior to Admission medications   Medication Sig Start Date End Date Taking? Authorizing Provider  ibuprofen  (ADVIL ) 200 MG tablet Take 200-800 mg by mouth every 6 (six) hours as needed for moderate pain (pain score 4-6).   Yes [provider]  ondansetron  (ZOFRAN -ODT) 4 MG disintegrating tablet 4mg  ODT q4 hours prn nausea/vomit 11/05/23  Yes Dezire Turk, DO    Allergies: Oxycodone     Review of Systems  Gastrointestinal:  Positive for abdominal pain.    Updated Vital Signs BP 115/74   Pulse 75   Temp 97.8 F (36.6 C) (Oral)   Resp 18   SpO2 96%   Physical Exam Vitals and nursing note reviewed.  Constitutional:      General: She is not in acute distress.    Appearance: She is well-developed. She is not diaphoretic.  HENT:     Head: Normocephalic and atraumatic.  Eyes:     Pupils: Pupils are equal, round, and reactive to light.  Cardiovascular:     Rate and Rhythm: Normal rate and regular rhythm.     Heart sounds: No murmur heard.    No friction rub. No gallop.  Pulmonary:     Effort: Pulmonary effort is normal.     Breath sounds: No wheezing or rales.  Abdominal:     General: There is no distension.     Palpations: Abdomen is soft.     Tenderness: There is abdominal tenderness.     Comments: Patient with more tenderness than expected to the upper abdomen  diffusely.  Musculoskeletal:        General: No tenderness.     Cervical back: Normal range of motion and neck supple.  Skin:    General: Skin is warm and dry.  Neurological:     Mental Status: She is alert and oriented to person, place, and time.  Psychiatric:        Behavior: Behavior normal.     (all labs ordered are listed, but only abnormal results are displayed) Labs Reviewed  COMPREHENSIVE METABOLIC PANEL WITH GFR - Abnormal; Notable for the following components:      Result Value   Glucose, Bld 367 (*)    ALT 59 (*)    All other components within normal limits  CBC - Abnormal; Notable for the following components:   WBC 14.3 (*)    All other components within normal limits  URINALYSIS, ROUTINE W REFLEX MICROSCOPIC - Abnormal; Notable for the following components:   Color, Urine STRAW (*)    Specific Gravity, Urine 1.035 (*)    Glucose, UA >=500 (*)    Hgb urine dipstick SMALL (*)    Ketones, ur 20 (*)    All other components within normal limits  I-STAT CHEM 8, ED - Abnormal; Notable for the following  components:   Glucose, Bld 375 (*)    Hemoglobin 16.0 (*)    HCT 47.0 (*)    All other components within normal limits  LIPASE, BLOOD  HCG, SERUM, QUALITATIVE    EKG: None  Radiology: US  Abdomen Limited RUQ (LIVER/GB) Result Date: 11/05/2023 CLINICAL DATA:  Right upper quadrant abdominal pain for 1 day with nausea, vomiting and diarrhea. EXAM: ULTRASOUND ABDOMEN LIMITED RIGHT UPPER QUADRANT COMPARISON:  Complete abdomen ultrasound dated 09/13/2014. Abdomen and pelvis CT obtained today. FINDINGS: Gallbladder: No gallstones or wall thickening visualized. No sonographic Murphy sign noted by sonographer. Common bile duct: Diameter: 3.3 mm Liver: Diffusely echogenic with coarse echotexture. Corresponding low density on the CT obtained today. No visible masses. Portal vein is patent on color Doppler imaging with normal direction of blood flow towards the liver. Other:  Limited examination due to body habitus. IMPRESSION: 1. No acute abnormality. 2. Hepatic steatosis. Electronically Signed   By: Elspeth Bathe M.D.   On: 11/05/2023 14:38   CT ABDOMEN PELVIS W CONTRAST Result Date: 11/05/2023 CLINICAL DATA:  Abdominal pain, nausea, vomiting, and diarrhea EXAM: CT ABDOMEN AND PELVIS WITH CONTRAST TECHNIQUE: Multidetector CT imaging of the abdomen and pelvis was performed using the standard protocol following bolus administration of intravenous contrast. RADIATION DOSE REDUCTION: This exam was performed according to the departmental dose-optimization program which includes automated exposure control, adjustment of the mA and/or kV according to patient size and/or use of iterative reconstruction technique. CONTRAST:  OMNIPAQUE  IOHEXOL  300 MG/ML  SOLN COMPARISON:  01/07/2020 FINDINGS: Lower chest: No acute abnormality. Hepatobiliary: No solid liver abnormality is seen. Previously described right lobe liver mass is not well appreciated on today's examination (series 2, image 20). Hepatomegaly, maximum coronal span 20.1 cm. Hepatic steatosis. No gallstones, gallbladder wall thickening, or biliary dilatation. Pancreas: Unremarkable. No pancreatic ductal dilatation or surrounding inflammatory changes. Spleen: Normal in size without significant abnormality. Adrenals/Urinary Tract: Adrenal glands are unremarkable. Kidneys are normal, without renal calculi, solid lesion, or hydronephrosis. Bladder is unremarkable. Stomach/Bowel: Stomach is within normal limits. Appendix appears normal. Fatty mural stratification of the cecum (series 8, image 88). No acute inflammatory findings. Vascular/Lymphatic: No significant vascular findings are present. No enlarged abdominal or pelvic lymph nodes. Reproductive: No mass or other significant abnormality. Other: No abdominal wall hernia or abnormality. No ascites. Musculoskeletal: No acute or significant osseous findings. IMPRESSION: 1. No acute CT  findings of the abdomen or pelvis to explain abdominal pain, nausea, vomiting, and diarrhea. 2. Fatty mural stratification of the cecum, consistent with chronic sequelae of prior infection or inflammation. This can be seen in inflammatory bowel disease if referable clinical history is present. No acute inflammatory findings. 3. Hepatomegaly and hepatic steatosis. Previously described right lobe liver mass is not well appreciated on today's examination. Electronically Signed   By: Marolyn JONETTA Jaksch M.D.   On: 11/05/2023 12:13     Procedures   Medications Ordered in the ED  HYDROmorphone  (DILAUDID ) injection 1 mg (1 mg Intravenous Given 11/05/23 0934)  metoCLOPramide  (REGLAN ) injection 10 mg (10 mg Intravenous Given 11/05/23 0934)  diphenhydrAMINE  (BENADRYL ) injection 25 mg (25 mg Intravenous Given 11/05/23 0934)  sodium chloride  0.9 % bolus 1,000 mL (0 mLs Intravenous Stopped 11/05/23 1054)  iohexol  (OMNIPAQUE ) 300 MG/ML solution 100 mL (100 mLs Intravenous Contrast Given 11/05/23 1058)  HYDROmorphone  (DILAUDID ) injection 1 mg (1 mg Intravenous Given 11/05/23 1143)  metoCLOPramide  (REGLAN ) injection 10 mg (10 mg Intravenous Given 11/05/23 1145)  diphenhydrAMINE  (BENADRYL ) injection 25 mg (25  mg Intravenous Given 11/05/23 1148)  sodium chloride  0.9 % bolus 1,000 mL (0 mLs Intravenous Stopped 11/05/23 1442)                                    Medical Decision Making Amount and/or Complexity of Data Reviewed Labs: ordered. Radiology: ordered.  Risk Prescription drug management.   23 yo F with a chief complaints of upper abdominal discomfort.  Started abruptly about 3 hours ago.  Associated with nausea vomiting and diarrhea.  By history it sounds like a viral illness.  Patient however has much more pain than I would anticipate.  Will obtain CT imaging.  Treat pain and nausea.  Reassess.  LFTs and lipase are unremarkable.  Mild leukocytosis.  Pregnancy test negative.  CT abdomen pelvis without obvious  acute intra-abdominal pathology.  I discussed this with the patient she does feel better but still is concerned about her discomfort.  Will obtain a right upper quadrant ultrasound.  Ultrasound is negative for gallbladder pathology.  Patient continues to have improved symptoms.  Will discharge home.  PCP follow-up.  2:45 PM:  I have discussed the diagnosis/risks/treatment options with the patient and family.  Evaluation and diagnostic testing in the emergency department does not suggest an emergent condition requiring admission or immediate intervention beyond what has been performed at this time.  They will follow up with PCP. We also discussed returning to the ED immediately if new or worsening sx occur. We discussed the sx which are most concerning (e.g., sudden worsening pain, fever, inability to tolerate by mouth) that necessitate immediate return. Medications administered to the patient during their visit and any new prescriptions provided to the patient are listed below.  Medications given during this visit Medications  HYDROmorphone  (DILAUDID ) injection 1 mg (1 mg Intravenous Given 11/05/23 0934)  metoCLOPramide  (REGLAN ) injection 10 mg (10 mg Intravenous Given 11/05/23 0934)  diphenhydrAMINE  (BENADRYL ) injection 25 mg (25 mg Intravenous Given 11/05/23 0934)  sodium chloride  0.9 % bolus 1,000 mL (0 mLs Intravenous Stopped 11/05/23 1054)  iohexol  (OMNIPAQUE ) 300 MG/ML solution 100 mL (100 mLs Intravenous Contrast Given 11/05/23 1058)  HYDROmorphone  (DILAUDID ) injection 1 mg (1 mg Intravenous Given 11/05/23 1143)  metoCLOPramide  (REGLAN ) injection 10 mg (10 mg Intravenous Given 11/05/23 1145)  diphenhydrAMINE  (BENADRYL ) injection 25 mg (25 mg Intravenous Given 11/05/23 1148)  sodium chloride  0.9 % bolus 1,000 mL (0 mLs Intravenous Stopped 11/05/23 1442)     The patient appears reasonably screen and/or stabilized for discharge and I doubt any other medical condition or other [facility] requiring further  screening, evaluation, or treatment in the ED at this time prior to discharge.       Final diagnoses:  Nausea vomiting and diarrhea    ED Discharge Orders          Ordered    ondansetron  (ZOFRAN -ODT) 4 MG disintegrating tablet        11/05/23 1444               Emil Share, DO 11/05/23 1445

## 2023-11-05 NOTE — Discharge Instructions (Signed)
 Take the nausea medicine for nausea.  You can take Imodium for diarrhea.  Please follow-up with your family doctor in the office.  Please return for worsening pain inability eat or drink.

## 2023-11-05 NOTE — ED Notes (Signed)
 Will follow up if pt can void for UA following IVF bolus completion.

## 2023-11-13 ENCOUNTER — Emergency Department (HOSPITAL_COMMUNITY)
Admission: EM | Admit: 2023-11-13 | Discharge: 2023-11-14 | Disposition: A | Discharge status: Home / Self Care | Attending: Emergency Medicine | Admitting: Emergency Medicine

## 2023-11-13 ENCOUNTER — Emergency Department (HOSPITAL_COMMUNITY)

## 2023-11-13 ENCOUNTER — Encounter (HOSPITAL_COMMUNITY): Payer: Self-pay

## 2023-11-13 ENCOUNTER — Other Ambulatory Visit: Payer: Self-pay

## 2023-11-13 DIAGNOSIS — M25552 Pain in left hip: Secondary | ICD-10-CM | POA: Diagnosis not present

## 2023-11-13 DIAGNOSIS — R0789 Other chest pain: Secondary | ICD-10-CM | POA: Diagnosis not present

## 2023-11-13 DIAGNOSIS — R209 Unspecified disturbances of skin sensation: Secondary | ICD-10-CM | POA: Insufficient documentation

## 2023-11-13 DIAGNOSIS — M25532 Pain in left wrist: Secondary | ICD-10-CM | POA: Insufficient documentation

## 2023-11-13 DIAGNOSIS — Z79899 Other long term (current) drug therapy: Secondary | ICD-10-CM | POA: Insufficient documentation

## 2023-11-13 DIAGNOSIS — R519 Headache, unspecified: Secondary | ICD-10-CM | POA: Diagnosis not present

## 2023-11-13 DIAGNOSIS — Y92410 Unspecified street and highway as the place of occurrence of the external cause: Secondary | ICD-10-CM | POA: Diagnosis not present
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
## 2023-11-14 ENCOUNTER — Emergency Department (HOSPITAL_COMMUNITY)

## 2023-11-14 ENCOUNTER — Encounter (HOSPITAL_COMMUNITY): Payer: Self-pay

## 2023-11-14 LAB — COMPREHENSIVE METABOLIC PANEL WITH GFR
ALT: 77 U/L — ABNORMAL HIGH (ref 0–44)
AST: 49 U/L — ABNORMAL HIGH (ref 15–41)
Albumin: 4.4 g/dL (ref 3.5–5.0)
Alkaline Phosphatase: 72 U/L (ref 38–126)
Anion gap: 16 — ABNORMAL HIGH (ref 5–15)
BUN: 7 mg/dL (ref 6–20)
CO2: 21 mmol/L — ABNORMAL LOW (ref 22–32)
Calcium: 9.6 mg/dL (ref 8.9–10.3)
Chloride: 100 mmol/L (ref 98–111)
Creatinine, Ser: 0.55 mg/dL (ref 0.44–1.00)
GFR, Estimated: >60 mL/min (ref >60)
Glucose, Bld: 249 mg/dL — ABNORMAL HIGH (ref 70–99)
Potassium: 3.3 mmol/L — ABNORMAL LOW (ref 3.5–5.1)
Sodium: 137 mmol/L (ref 135–145)
Total Bilirubin: 1 mg/dL (ref 0.0–1.2)
Total Protein: 7.2 g/dL (ref 6.5–8.1)

## 2023-11-14 LAB — CBC WITH DIFFERENTIAL/PLATELET
Abs Immature Granulocytes: 0.04 K/uL (ref 0.00–0.07)
Basophils Absolute: 0 K/uL (ref 0.0–0.1)
Basophils Relative: 0 %
Eosinophils Absolute: 0 K/uL (ref 0.0–0.5)
Eosinophils Relative: 0 %
HCT: 43.5 % (ref 36.0–46.0)
Hemoglobin: 14 g/dL (ref 12.0–15.0)
Immature Granulocytes: 0 %
Lymphocytes Relative: 23 %
Lymphs Abs: 2.6 K/uL (ref 0.7–4.0)
MCH: 30 pg (ref 26.0–34.0)
MCHC: 32.2 g/dL (ref 30.0–36.0)
MCV: 93.3 fL (ref 80.0–100.0)
Monocytes Absolute: 0.7 K/uL (ref 0.1–1.0)
Monocytes Relative: 6 %
Neutro Abs: 8.2 K/uL — ABNORMAL HIGH (ref 1.7–7.7)
Neutrophils Relative %: 71 %
Platelets: 281 K/uL (ref 150–400)
RBC: 4.66 MIL/uL (ref 3.87–5.11)
RDW: 11.9 % (ref 11.5–15.5)
WBC: 11.6 K/uL — ABNORMAL HIGH (ref 4.0–10.5)
nRBC: 0 % (ref 0.0–0.2)

## 2023-11-14 LAB — HCG, SERUM, QUALITATIVE: Preg, Serum: NEGATIVE

## 2023-11-14 LAB — URINE DRUG SCREEN
Amphetamines: NEGATIVE
Barbiturates: NEGATIVE
Benzodiazepines: NEGATIVE
Cocaine: NEGATIVE
Fentanyl: NEGATIVE
Methadone Scn, Ur: NEGATIVE
Opiates: NEGATIVE
Tetrahydrocannabinol: NEGATIVE

## 2023-11-14 LAB — ETHANOL: Alcohol, Ethyl (B): <15 mg/dL (ref <15)

## 2023-11-14 MED ORDER — SODIUM CHLORIDE 0.9 % IV BOLUS
1000.0000 mL | Freq: Once | INTRAVENOUS | Status: AC
  Administered 2023-11-14: 1000 mL via INTRAVENOUS

## 2023-11-14 MED ORDER — KETOROLAC TROMETHAMINE 30 MG/ML IJ SOLN
15.0000 mg | Freq: Once | INTRAMUSCULAR | Status: AC
  Administered 2023-11-14: 15 mg via INTRAVENOUS
  Filled 2023-11-14: qty 1

## 2023-11-14 MED ORDER — ONDANSETRON HCL 4 MG/2ML IJ SOLN
4.0000 mg | Freq: Once | INTRAMUSCULAR | Status: AC
  Administered 2023-11-14: 4 mg via INTRAVENOUS
  Filled 2023-11-14: qty 2

## 2023-11-14 MED ORDER — MIDAZOLAM HCL 2 MG/2ML IJ SOLN
2.0000 mg | Freq: Once | INTRAMUSCULAR | Status: DC | PRN
  Filled 2023-11-14: qty 2

## 2023-11-14 MED ORDER — HYDROCODONE-ACETAMINOPHEN 5-325 MG PO TABS
1.0000 | ORAL_TABLET | Freq: Once | ORAL | Status: AC
  Administered 2023-11-14: 1 via ORAL
  Filled 2023-11-14: qty 1

## 2023-11-14 MED ORDER — IBUPROFEN 800 MG PO TABS
800.0000 mg | ORAL_TABLET | Freq: Once | ORAL | Status: AC
  Administered 2023-11-14: 800 mg via ORAL
  Filled 2023-11-14: qty 1

## 2023-11-14 MED ORDER — HYDROMORPHONE HCL 1 MG/ML IJ SOLN
1.0000 mg | Freq: Once | INTRAMUSCULAR | Status: AC
  Administered 2023-11-14: 1 mg via INTRAVENOUS
  Filled 2023-11-14: qty 1

## 2023-11-14 MED ORDER — KETOROLAC TROMETHAMINE 15 MG/ML IJ SOLN
15.0000 mg | Freq: Once | INTRAMUSCULAR | Status: AC
  Administered 2023-11-14: 15 mg via INTRAVENOUS
  Filled 2023-11-14: qty 1

## 2023-11-14 MED ORDER — ACETAMINOPHEN ER 650 MG PO TBCR
650.0000 mg | EXTENDED_RELEASE_TABLET | Freq: Three times a day (TID) | ORAL | 0 refills | Status: AC | PRN
Start: 2023-11-14 — End: 2023-11-24

## 2023-11-14 MED ORDER — IOHEXOL 300 MG/ML  SOLN
100.0000 mL | Freq: Once | INTRAMUSCULAR | Status: AC | PRN
  Administered 2023-11-14: 100 mL via INTRAVENOUS

## 2023-11-14 MED ORDER — MELOXICAM 7.5 MG PO TABS
7.5000 mg | ORAL_TABLET | Freq: Every day | ORAL | 0 refills | Status: AC
Start: 2023-11-14 — End: ?

## 2023-11-14 NOTE — ED Notes (Signed)
 Patient was unable to stand for 3 mins during orthostatic vitals due to pain. Patient was very tearful.

## 2023-11-14 NOTE — ED Notes (Signed)
 Patient transported to CT
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
## 2023-11-14 NOTE — ED Notes (Signed)
 Assisted Pt with ambulation. Pt ambulated with a walker from bedside to doorway of room. Pt stated that they felt pins and needles down left side of body and felt dizzy. Pt felt they could not walk further than the doorway nor without using the walker. Pt was crying after ambulation from pain.
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
## 2023-11-14 NOTE — Discharge Instructions (Addendum)
 Thank you for letting us  evaluate you today.  Please follow-up with neurosurgeon for this protrusion found in your lower spine.  Your CT imaging of your head did not show any brain bleed.  Your CT of your chest, abdomen, pelvis did not show any acute traumatic injury. You do not have a fracture of your pelvis nor chest wall  Return to ED if you experience chest pain, shortness of breath, inability to feel arms or legs, altered mentation, loss of bowel or urinary function, inability to feel genital region
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
## 2023-11-14 NOTE — ED Notes (Signed)
 Attempted to ambulate patient. Patient could not take more than two steps past the edge of the stretcher. Patient stated she is experiencing numbness and tingling on her left side still. MD notified.

## 2023-11-23 ENCOUNTER — Encounter (HOSPITAL_COMMUNITY): Payer: Self-pay

## 2023-11-23 ENCOUNTER — Other Ambulatory Visit: Payer: Self-pay

## 2023-11-23 ENCOUNTER — Emergency Department (HOSPITAL_COMMUNITY)
Admission: EM | Admit: 2023-11-23 | Discharge: 2023-11-23 | Disposition: A | Discharge status: Home / Self Care | Attending: Emergency Medicine | Admitting: Emergency Medicine

## 2023-11-23 DIAGNOSIS — R202 Paresthesia of skin: Secondary | ICD-10-CM | POA: Insufficient documentation

## 2023-11-23 DIAGNOSIS — M546 Pain in thoracic spine: Secondary | ICD-10-CM | POA: Diagnosis present

## 2023-11-23 DIAGNOSIS — M545 Low back pain, unspecified: Secondary | ICD-10-CM

## 2023-11-23 DIAGNOSIS — M79602 Pain in left arm: Secondary | ICD-10-CM | POA: Insufficient documentation

## 2023-11-23 DIAGNOSIS — M79605 Pain in left leg: Secondary | ICD-10-CM | POA: Diagnosis not present

## 2023-11-23 DIAGNOSIS — I10 Essential (primary) hypertension: Secondary | ICD-10-CM | POA: Diagnosis not present

## 2023-11-23 MED ORDER — ACETAMINOPHEN 325 MG PO TABS
975.0000 mg | ORAL_TABLET | Freq: Once | ORAL | Status: AC
  Administered 2023-11-23: 975 mg via ORAL
  Filled 2023-11-23: qty 3

## 2023-11-23 MED ORDER — KETOROLAC TROMETHAMINE 30 MG/ML IJ SOLN
30.0000 mg | Freq: Once | INTRAMUSCULAR | Status: AC
  Administered 2023-11-23: 30 mg via INTRAMUSCULAR
  Filled 2023-11-23: qty 1

## 2023-11-23 MED ORDER — LIDOCAINE 5 % EX PTCH
3.0000 | MEDICATED_PATCH | CUTANEOUS | Status: DC
  Administered 2023-11-23: 3 via TRANSDERMAL
  Filled 2023-11-23: qty 3

## 2023-11-23 MED ORDER — LIDOCAINE 5 % EX PTCH: 1.0000 | MEDICATED_PATCH | CUTANEOUS | 0 refills | Status: AC
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
## 2023-11-23 NOTE — Discharge Instructions (Addendum)
 Thank you for letting us  evaluate you today. I have sent lidocaine  patches to your pharmacy. If they are too expensive, you can pick up OTC lidocaine  patches called Salonpas or Voltaren gel for topical pain relief  Continue taking tylenol  and meloxicam  as prescribed and follow up with neurology
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
